# Patient Record
Sex: Male | Born: 1960 | Race: Black or African American | Hispanic: No | Smoking: Former smoker
Health system: Southern US, Community
[De-identification: ages and names within clinical notes are randomized; demographics above are authoritative.]

## PROBLEM LIST (undated history)

## (undated) DIAGNOSIS — T7840XA Allergy, unspecified, initial encounter: Secondary | ICD-10-CM

## (undated) DIAGNOSIS — M1990 Unspecified osteoarthritis, unspecified site: Secondary | ICD-10-CM

## (undated) DIAGNOSIS — K219 Gastro-esophageal reflux disease without esophagitis: Secondary | ICD-10-CM

## (undated) DIAGNOSIS — E785 Hyperlipidemia, unspecified: Secondary | ICD-10-CM

## (undated) HISTORY — DX: Gastro-esophageal reflux disease without esophagitis: K21.9

## (undated) HISTORY — DX: Allergy, unspecified, initial encounter: T78.40XA

## (undated) HISTORY — DX: Unspecified osteoarthritis, unspecified site: M19.90

## (undated) HISTORY — DX: Hyperlipidemia, unspecified: E78.5

---

## 2000-09-29 HISTORY — PX: ACHILLES TENDON SURGERY: SHX542

## 2007-07-21 ENCOUNTER — Ambulatory Visit: Payer: Self-pay | Admitting: Family Medicine

## 2007-09-22 ENCOUNTER — Ambulatory Visit: Payer: Self-pay | Admitting: Internal Medicine

## 2007-09-27 ENCOUNTER — Ambulatory Visit: Payer: Self-pay | Admitting: *Deleted

## 2007-09-30 HISTORY — PX: JOINT REPLACEMENT: SHX530

## 2007-09-30 HISTORY — PX: HIP SURGERY: SHX245

## 2008-01-26 ENCOUNTER — Ambulatory Visit: Payer: Self-pay | Admitting: Family Medicine

## 2008-03-07 ENCOUNTER — Ambulatory Visit: Payer: Self-pay | Admitting: Internal Medicine

## 2008-04-06 ENCOUNTER — Ambulatory Visit: Payer: Self-pay | Admitting: Internal Medicine

## 2008-04-08 ENCOUNTER — Ambulatory Visit (HOSPITAL_COMMUNITY): Admission: RE | Admit: 2008-04-08 | Discharge: 2008-04-08 | Payer: Self-pay | Admitting: Family Medicine

## 2008-10-11 ENCOUNTER — Ambulatory Visit: Payer: Self-pay | Admitting: Internal Medicine

## 2008-10-12 ENCOUNTER — Encounter (INDEPENDENT_AMBULATORY_CARE_PROVIDER_SITE_OTHER): Payer: Self-pay | Admitting: Family Medicine

## 2008-10-12 LAB — CONVERTED CEMR LAB
AST: 38 units/L — ABNORMAL HIGH (ref 0–37)
CO2: 26 meq/L (ref 19–32)
Eosinophils Absolute: 0.3 10*3/uL (ref 0.0–0.7)
Eosinophils Relative: 5 % (ref 0–5)
Glucose, Bld: 104 mg/dL — ABNORMAL HIGH (ref 70–99)
Hemoglobin: 14.4 g/dL (ref 13.0–17.0)
Lymphocytes Relative: 29 % (ref 12–46)
Lymphs Abs: 1.8 10*3/uL (ref 0.7–4.0)
MCHC: 31.6 g/dL (ref 30.0–36.0)
Neutro Abs: 3.5 10*3/uL (ref 1.7–7.7)
Neutrophils Relative %: 58 % (ref 43–77)
Sodium: 144 meq/L (ref 135–145)
Total Protein: 7.8 g/dL (ref 6.0–8.3)
WBC: 6.1 10*3/uL (ref 4.0–10.5)

## 2008-12-12 ENCOUNTER — Inpatient Hospital Stay (HOSPITAL_COMMUNITY): Admission: RE | Admit: 2008-12-12 | Discharge: 2008-12-14 | Payer: Self-pay | Admitting: Orthopedic Surgery

## 2009-02-14 ENCOUNTER — Ambulatory Visit: Payer: Self-pay | Admitting: Internal Medicine

## 2009-04-13 ENCOUNTER — Emergency Department (HOSPITAL_COMMUNITY): Admission: EM | Admit: 2009-04-13 | Discharge: 2009-04-13 | Payer: Self-pay | Admitting: Emergency Medicine

## 2009-04-25 ENCOUNTER — Ambulatory Visit: Payer: Self-pay | Admitting: Internal Medicine

## 2009-04-25 LAB — CONVERTED CEMR LAB
BUN: 19 mg/dL (ref 6–23)
Calcium: 9.3 mg/dL (ref 8.4–10.5)
Glucose, Bld: 92 mg/dL (ref 70–99)
PSA: 1.13 ng/mL (ref 0.10–4.00)

## 2009-05-01 ENCOUNTER — Ambulatory Visit: Payer: Self-pay | Admitting: Internal Medicine

## 2009-05-03 ENCOUNTER — Ambulatory Visit (HOSPITAL_COMMUNITY): Admission: RE | Admit: 2009-05-03 | Discharge: 2009-05-03 | Payer: Self-pay | Admitting: Internal Medicine

## 2009-05-17 ENCOUNTER — Ambulatory Visit: Payer: Self-pay | Admitting: Internal Medicine

## 2009-05-29 ENCOUNTER — Encounter: Admission: RE | Admit: 2009-05-29 | Discharge: 2009-06-19 | Payer: Self-pay | Admitting: Orthopedic Surgery

## 2009-07-02 ENCOUNTER — Encounter: Admission: RE | Admit: 2009-07-02 | Discharge: 2009-07-02 | Payer: Self-pay | Admitting: Family Medicine

## 2009-07-19 ENCOUNTER — Ambulatory Visit (HOSPITAL_COMMUNITY): Admission: RE | Admit: 2009-07-19 | Discharge: 2009-07-19 | Payer: Self-pay | Admitting: Orthopedic Surgery

## 2011-01-05 LAB — CBC
HCT: 42.8 % (ref 39.0–52.0)
Hemoglobin: 14.6 g/dL (ref 13.0–17.0)
MCV: 93.3 fL (ref 78.0–100.0)
Platelets: 239 10*3/uL (ref 150–400)
RBC: 4.58 MIL/uL (ref 4.22–5.81)
WBC: 5.8 10*3/uL (ref 4.0–10.5)

## 2011-01-05 LAB — RAPID URINE DRUG SCREEN, HOSP PERFORMED
Amphetamines: NOT DETECTED
Barbiturates: NOT DETECTED
Benzodiazepines: NOT DETECTED
Tetrahydrocannabinol: NOT DETECTED

## 2011-01-05 LAB — BASIC METABOLIC PANEL
BUN: 11 mg/dL (ref 6–23)
CO2: 24 mEq/L (ref 19–32)
Chloride: 107 mEq/L (ref 96–112)
GFR calc Af Amer: >60 mL/min (ref >60)
GFR calc non Af Amer: >60 mL/min (ref >60)
Glucose, Bld: 104 mg/dL — ABNORMAL HIGH (ref 70–99)
Potassium: 3.8 mEq/L (ref 3.5–5.1)

## 2011-01-05 LAB — DIFFERENTIAL
Basophils Relative: 1 % (ref 0–1)
Eosinophils Absolute: 0.2 10*3/uL (ref 0.0–0.7)
Eosinophils Relative: 4 % (ref 0–5)
Neutrophils Relative %: 47 % (ref 43–77)

## 2011-01-09 LAB — URINALYSIS, ROUTINE W REFLEX MICROSCOPIC
Bilirubin Urine: NEGATIVE
Hgb urine dipstick: NEGATIVE
Ketones, ur: NEGATIVE mg/dL
Protein, ur: NEGATIVE mg/dL
Specific Gravity, Urine: 1.03 (ref 1.005–1.030)
Urobilinogen, UA: 1 mg/dL (ref 0.0–1.0)
pH: 6.5 (ref 5.0–8.0)

## 2011-01-09 LAB — BASIC METABOLIC PANEL
BUN: 6 mg/dL (ref 6–23)
BUN: 8 mg/dL (ref 6–23)
Calcium: 8.2 mg/dL — ABNORMAL LOW (ref 8.4–10.5)
Chloride: 101 mEq/L (ref 96–112)
Chloride: 106 mEq/L (ref 96–112)
Creatinine, Ser: 0.84 mg/dL (ref 0.4–1.5)
GFR calc Af Amer: >60 mL/min (ref >60)
GFR calc non Af Amer: >60 mL/min (ref >60)
GFR calc non Af Amer: >60 mL/min (ref >60)
Glucose, Bld: 97 mg/dL (ref 70–99)
Potassium: 3.6 mEq/L (ref 3.5–5.1)
Potassium: 3.8 mEq/L (ref 3.5–5.1)
Potassium: 4.3 mEq/L (ref 3.5–5.1)
Sodium: 140 mEq/L (ref 135–145)

## 2011-01-09 LAB — CBC
HCT: 33.9 % — ABNORMAL LOW (ref 39.0–52.0)
HCT: 35.5 % — ABNORMAL LOW (ref 39.0–52.0)
HCT: 42.6 % (ref 39.0–52.0)
MCHC: 33.7 g/dL (ref 30.0–36.0)
MCV: 94.7 fL (ref 78.0–100.0)
Platelets: 219 10*3/uL (ref 150–400)
Platelets: 259 10*3/uL (ref 150–400)
RBC: 3.58 MIL/uL — ABNORMAL LOW (ref 4.22–5.81)
RDW: 13.2 % (ref 11.5–15.5)
RDW: 13.4 % (ref 11.5–15.5)

## 2011-01-09 LAB — DIFFERENTIAL
Eosinophils Absolute: 0.1 10*3/uL (ref 0.0–0.7)
Lymphocytes Relative: 28 % (ref 12–46)
Lymphs Abs: 1.8 10*3/uL (ref 0.7–4.0)
Monocytes Absolute: 0.4 10*3/uL (ref 0.1–1.0)
Neutro Abs: 4 10*3/uL (ref 1.7–7.7)
Neutrophils Relative %: 63 % (ref 43–77)

## 2011-01-09 LAB — TYPE AND SCREEN: ABO/RH(D): O POS

## 2011-01-09 LAB — PROTIME-INR
INR: 1 (ref 0.00–1.49)
Prothrombin Time: 12.9 seconds (ref 11.6–15.2)

## 2011-01-09 LAB — APTT: aPTT: 30 seconds (ref 24–37)

## 2011-02-11 NOTE — H&P (Signed)
NAMEDERAY, DAWES       ACCOUNT NO.:  [id number]   MEDICAL RECORD NO.:  [id number]          PATIENT TYPE:  INP   LOCATION:  NA                           FACILITY:  [facility]   PHYSICIAN:  Madlyn Frankel. Charlann Boxer, M.D.  DATE OF BIRTH:  1960-12-10   DATE OF ADMISSION:  12/12/2008  DATE OF DISCHARGE:                              HISTORY & PHYSICAL   PROCEDURE:  Left total hip replacement.   CHIEF COMPLAINT:  Left hip pain.   HISTORY OF PRESENT ILLNESS:  A 50 year old male with history of left hip  pain secondary to osteoarthritis.  It has been refractory to all  conservative treatment.  He has been using a cane to assist with  ambulation.   PRIMARY CARE PHYSICIAN:  Dineen Kid. Reche Dixon, M.D. with [facility].   PAST MEDICAL HISTORY:  1. Significant for osteoarthritis.  2. Degenerative disk disease.   PAST SURGERIES:  Bilateral Achilles tendon repair.   FAMILY HISTORY:  Arthritis.   SOCIAL HISTORY:  Nonsmoker.   DRUG ALLERGIES:  NO KNOWN DRUG ALLERGIES.   MEDICATIONS:  Vicodin 7.5/750 one p.o. q.8 h. p.r.n. pain.   REVIEW OF SYSTEMS:  None other than HPI.   PHYSICAL EXAMINATION:  VITAL SIGNS:  Pulse 72, respirations 16, blood  pressure 116/86.  GENERAL:  Awake, alert and oriented, well-developed, well-nourished.  HEENT:  Normocephalic.  NECK:  Supple.  No carotid bruits.  CHEST/LUNGS:  Clear to auscultation bilaterally.  BREASTS:  Deferred.  HEART:  S1-S2 distinct.  ABDOMEN:  Soft, nontender, bowel sounds are present.  PELVIS:  Stable.  GENITOURINARY:  Deferred.  EXTREMITIES:  Left hip has decreased range of motion and increased pain.  SKIN:  No cellulitis.  NEUROLOGIC:  Intact distal sensibilities.   LABORATORY DATA:  Labs, EKG, chest x-ray are all pending presurgical  testing.   IMPRESSION:  Left hip osteoarthritis.   PLAN OF ACTION:  Left total hip replacement by Dr. Charlann Boxer at [facility],  December 12, 2008.   Postoperative medications provided today including  aspirin for DVT  prophylaxis.     ______________________________  Yetta Glassman [address], [place]      Madlyn Frankel. Charlann Boxer, M.D.  Electronically Signed    BLM/MEDQ  D:  12/04/2008  T:  12/05/2008  Job:  [id number]   cc:   Dineen Kid. Reche Dixon, M.D.  Fax: [phone]

## 2011-02-11 NOTE — Discharge Summary (Signed)
NAMEJASMOND, Mark Hale       ACCOUNT NO.:  [id number]   MEDICAL RECORD NO.:  [id number]          PATIENT TYPE:  INP   LOCATION:  1603                         FACILITY:  [facility]   PHYSICIAN:  Madlyn Frankel. Charlann Boxer, M.D.  DATE OF BIRTH:  Feb 22, 1961   DATE OF ADMISSION:  12/12/2008  DATE OF DISCHARGE:                               DISCHARGE SUMMARY   ADMISSION DIAGNOSES:  1. Osteoarthritis.  2. Degenerative disc disease.   DISCHARGE DIAGNOSES:  1. Osteoarthritis.  2. Degenerative disc disease.   HISTORY OF PRESENT ILLNESS:  A 50 year old male with a history of left  hip pain secondary to osteoarthritis that is refractory to all  conservative treatment.   CONSULTATIONS:  None.   PROCEDURE:  Left total hip replacement by surgeon Dr. Durene Romans,  assistant Dwyane Luo, PA-C.   LABORATORY DATA:  CBC final reading white blood cell count 9.2,  hemoglobin 11.4, hematocrit 33.9, platelet count 198,000.  Metabolic  panel:  Sodium 132, potassium 3.6, BUN 8, creatinine 0.84, glucose 118.   CLINICAL DATA:  Cardiology, EKG:  Normal sinus rhythm.   HOSPITAL COURSE:  The patient underwent a left total hip replacement by  surgeon Dr. Durene Romans and was admitted to the orthopedic floor.  His  stay was unremarkable.  He remained hemodynamically and orthopedically  stable throughout his course of stay.  The Hemovac was discontinued on  day #1.  Her remained neurovascularly intact in his left lower extremity  throughout.  Dressing was changed.  There was no significant drainage  from the wound.  The wound looked good.  He was weight-bearing as  tolerated, making good progress with physical therapy and by day #2 had  met all criteria for discharge home in stable and improved condition.  He was on aspirin for DVT prophylaxis going home.   DISCHARGE DISPOSITION:  Discharged home with home health care PT in  stable and improved condition.   ACTIVITY:  Physical therapy with weight-bearing as  tolerated with a  walker.   DISCHARGE DIET:  Heart-healthy.   DISCHARGE WOUND CARE:  Keep dry.   DISCHARGE MEDICATIONS:  1. Aspirin 325 mg p.o. b.i.d. x6 weeks.  2. Robaxin 500 mg one p.o. q.6h.  3. Iron 325 mg one p.o. t.i.d. x2 weeks.  4. Colace 100 mg one p.o. b.i.d. p.r.n. constipation.  5. MiraLax 17 grams p.o. daily p.r.n.  6. Norco 7.5/325 one to two tabs every 4 to 6 hours p.r.n. pain.  7. Naproxen p.r.n.  8. Glucosamine p.o. daily.   DISCHARGE FOLLOWUP:  Follow up with Dr. Charlann Boxer at phone number [phone] in  2 weeks for wound check.     ______________________________  Mark Hale, Mark Hale      Madlyn Frankel. Charlann Boxer, M.D.  Electronically Signed    BLM/MEDQ  D:  12/14/2008  T:  12/14/2008  Job:  [id number]   cc:   Dineen Kid. Reche Dixon, M.D.  Fax: [phone]

## 2011-02-11 NOTE — Op Note (Signed)
NAMEBRENNAN, LITZINGER       ACCOUNT NO.:  [id number]   MEDICAL RECORD NO.:  [id number]          PATIENT TYPE:  INP   LOCATION:  1603                         FACILITY:  [facility]   PHYSICIAN:  Madlyn Frankel. Charlann Boxer, M.D.  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   PROCEDURE:  Left total knee replacement.   COMPONENTS:  A DePuy hip system size 56 pinnacle cup,  2 cancellous bone  screws, a 40 mm neutral liner and a 5 high trial lock stem with a 40  +1.5 metal ball.   SURGEON:  Luna Fuse, M.D.   ASSISTANT:  Dwyane Luo, PA   ANESTHESIA:  General.   BLOOD LOSS:  400 mL.   DRAINS:  None.   COMPLICATIONS:  None.   SPECIMEN:  None.   FINDINGS:  None.   INDICATION FOR THE PROCEDURE:  Mr. Oats is a 50 year old gentleman  referred for evaluation of progressive left hip pain with radiographic  evidence of advanced severe degenerative joint changes.  He had failed  any conservative measures and has a significant reduction in his quality  of life due to pain and stiffness and he wished to proceed with  arthroplasty.  Risks of infection, DVT, component failure, dislocation  as well as potential need for revision surgery in addition to discussion  of bearing surfaces were all reviewed and consent was obtained for  benefit of pain relief.   PROCEDURE IN DETAIL:  The patient was brought to the operative theater.  Once adequate anesthesia, preoperative antibiotics, Ancef and  preoperative anesthesia established, the patient was positioned into the  right lateral decubitus position with left side up.   Preoperative  examination of extremities identified that the patient was slightly  shortened on his left side and this was kept in mind during the  procedure, particularly in the position laterally.   The left lower extremity was then prepped and draped after a pre scrub  in a normal routine fashion.  A time-out was performed identifying the  patient and planned procedure of the extremity.  The lateral based  incision was made for a posterior approach of the hip.  The iliotibial  band and gluteal fascia were dissected out and then incised posteriorly.  The short external rotators were taken down except for the posterior  capsule and then a capsulotomy was made preserving the posterior leaflet  for later anatomic repair but also to protect the sciatic nerve from  retractors.  The hip was dislocated and severe degenerative change was  noted. The neck osteotomy was made and the patient had a loose body  which was extracapsular to the inferior aspect of the capsule.  It also  had a large posterior neck osteophyte and a neck osteotomy was made in  the trochanteric fossa  utilizing a broach for a trial neck and head  segment with placement in the center of the head.  Attention was now  directed to the femur where the canal was opened and the drill hand  reamed once and then irrigated to prevent fat emboli.  I was able to  broach all way up to a size 5 which sat level with the neck cut and  stable medial and laterally.   The broach was removed.  Attention was now directed to the acetabulum.  The femur was packed with a sponge to prevent oozing.   The acetabular retractors were placed.  I removed and debrided the  labrum using a long knife blade.  I then began reaming the 44 reamer and  actually reamed all the up to 0.55 reamer.  The patient had good  bleeding bone, but it was sclerotic due to the advanced degenerative  changes but nonetheless very good bleeding bone.  A 56 pinnacle cup was   impacted using the cup position and anatomically evaluated beneath the  rim anteriorly.  A portion of the cup was exposed superolateral.  The  cup appeared to be abducted about 35-40 degrees and forward flexed 20  degrees.  I went ahead placed two cancellous bone screws in addition to  a hole eliminator and the final 40 neutral metal liner.  Trial reduction  was now carried out with a 5 broach.  I chose to use the high offset  neck based on his radiograph and appearance of the hip joint.  The 41.5  ball of the hip was very stable.  Leg lengths appeared to be good  compared to the down leg compared to the preoperative positioning.   There was no evidence of any subluxation.  At this point, the trial  broach was removed and the final 5 high trial lock stem was then opened  and sat level where the broach set.  I re-trialed with a minus ball just  to make sure it would not cause any problems and felt that the 1.5 ball  gave [place] a little bit more length than we needed.   The final 40 +1.5 ball was then impacted onto a clean and dry trunnion  at the level of the trochanter anatomically.   At this point,the hip was reduced.  It had been irrigated throughout the  case and again at this point.  We reapproximated the posterior capsule  superiorly using a 1-Vicryl.  The remainder of the wound was closed at  this point over a medium Hemovac drain and another Vicryl in the  iliotibial band and gluteal fascia, 2-0 Vicryl in the subcu layer and  running 4-0 Monocryl and the hip was cleaned, dried and dressed  sterilely with Steri-Strips and Mepilex dressing.  She was then brought  into the recovery room in stable condition tolerating the procedure  well.      Madlyn Frankel Charlann Boxer, M.D.  Electronically Signed     MDO/MEDQ  D:  12/12/2008  T:  12/12/2008  Job:  [id number]

## 2012-01-26 ENCOUNTER — Other Ambulatory Visit (HOSPITAL_COMMUNITY): Payer: Self-pay | Admitting: Internal Medicine

## 2012-01-26 ENCOUNTER — Ambulatory Visit (HOSPITAL_COMMUNITY)
Admission: RE | Admit: 2012-01-26 | Discharge: 2012-01-26 | Disposition: A | Discharge status: Home / Self Care | Payer: Medicaid Other | Source: Ambulatory Visit | Attending: Internal Medicine | Admitting: Internal Medicine

## 2012-01-26 DIAGNOSIS — M5126 Other intervertebral disc displacement, lumbar region: Secondary | ICD-10-CM

## 2012-01-26 DIAGNOSIS — M545 Low back pain, unspecified: Secondary | ICD-10-CM | POA: Insufficient documentation

## 2012-01-26 DIAGNOSIS — M51379 Other intervertebral disc degeneration, lumbosacral region without mention of lumbar back pain or lower extremity pain: Secondary | ICD-10-CM | POA: Insufficient documentation

## 2012-01-26 DIAGNOSIS — M5137 Other intervertebral disc degeneration, lumbosacral region: Secondary | ICD-10-CM | POA: Insufficient documentation

## 2013-03-05 DIAGNOSIS — F329 Major depressive disorder, single episode, unspecified: Secondary | ICD-10-CM | POA: Insufficient documentation

## 2013-03-05 DIAGNOSIS — G894 Chronic pain syndrome: Secondary | ICD-10-CM | POA: Insufficient documentation

## 2013-03-05 DIAGNOSIS — Z8781 Personal history of (healed) traumatic fracture: Secondary | ICD-10-CM | POA: Insufficient documentation

## 2013-03-05 DIAGNOSIS — M545 Low back pain, unspecified: Secondary | ICD-10-CM | POA: Insufficient documentation

## 2013-03-05 DIAGNOSIS — M159 Polyosteoarthritis, unspecified: Secondary | ICD-10-CM | POA: Insufficient documentation

## 2013-08-08 DIAGNOSIS — E669 Obesity, unspecified: Secondary | ICD-10-CM | POA: Insufficient documentation

## 2013-08-08 DIAGNOSIS — F17200 Nicotine dependence, unspecified, uncomplicated: Secondary | ICD-10-CM | POA: Insufficient documentation

## 2013-08-08 DIAGNOSIS — M47817 Spondylosis without myelopathy or radiculopathy, lumbosacral region: Secondary | ICD-10-CM | POA: Insufficient documentation

## 2014-02-10 DIAGNOSIS — M7711 Lateral epicondylitis, right elbow: Secondary | ICD-10-CM | POA: Insufficient documentation

## 2014-03-08 ENCOUNTER — Encounter (INDEPENDENT_AMBULATORY_CARE_PROVIDER_SITE_OTHER): Payer: Self-pay | Admitting: Surgery

## 2014-03-08 ENCOUNTER — Other Ambulatory Visit (INDEPENDENT_AMBULATORY_CARE_PROVIDER_SITE_OTHER): Payer: Self-pay | Admitting: Surgery

## 2014-03-13 ENCOUNTER — Ambulatory Visit (INDEPENDENT_AMBULATORY_CARE_PROVIDER_SITE_OTHER): Payer: Medicaid Other | Admitting: Surgery

## 2014-03-29 ENCOUNTER — Encounter (INDEPENDENT_AMBULATORY_CARE_PROVIDER_SITE_OTHER): Payer: Self-pay | Admitting: Surgery

## 2014-03-29 ENCOUNTER — Ambulatory Visit (INDEPENDENT_AMBULATORY_CARE_PROVIDER_SITE_OTHER): Payer: Medicaid Other | Admitting: Surgery

## 2014-03-29 VITALS — BP 124/83 | HR 79 | Temp 97.7°F | Resp 18 | Ht 66.0 in | Wt 220.0 lb

## 2014-03-29 DIAGNOSIS — L989 Disorder of the skin and subcutaneous tissue, unspecified: Secondary | ICD-10-CM | POA: Insufficient documentation

## 2014-03-29 DIAGNOSIS — K429 Umbilical hernia without obstruction or gangrene: Secondary | ICD-10-CM

## 2014-03-29 NOTE — Progress Notes (Signed)
Patient ID: Mark Hale, male   DOB: [birth year], 53 y.o.   MRN: [id number]  Chief Complaint  Patient presents with  . Hernia    HPI Mark Hale is a 53 y.o. male.  Referred by Dr. Lindwood Qua for umbilical hernia and enlarging skin lesion - abdominal wall  HPI This is a 53 year old male who presents with many years of a small reducible umbilical hernia. This is slightly enlarged. He denies any obstructive symptoms. He also has a protruding skin lesion on the left side of his abdominal wall that has become larger. Occasionally this bleeds. He is now referred for excision of the skin lesion as well as umbilical hernia repair.   Past Medical History  Diagnosis Date  . Arthritis   Chronic back pain - disabled  Past Surgical History  Procedure Laterality Date  . Hip surgery Left 2009  . Achilles tendon surgery Bilateral 2002    History reviewed. No pertinent family history.  Social History History  Substance Use Topics  . Smoking status: Current Every Day Smoker -- 1.00 packs/day    Types: Cigars  . Smokeless tobacco: Not on file  . Alcohol Use: Yes     Comment: a beer sometimes    No Known Allergies  Current Outpatient Prescriptions  Medication Sig Dispense Refill  . HYDROcodone-acetaminophen (NORCO/VICODIN) 5-325 MG per tablet Take 1 tablet by mouth every 6 (six) hours as needed for moderate pain.      Marland Kitchen omeprazole (PRILOSEC) 20 MG capsule Take 20 mg by mouth daily.      . simvastatin (ZOCOR) 20 MG tablet Take 20 mg by mouth daily.      . traMADol (ULTRAM) 50 MG tablet Take by mouth every 6 (six) hours as needed.       No current facility-administered medications for this visit.    Review of Systems Review of Systems  Constitutional: Negative for fever, chills and unexpected weight change.  HENT: Negative for congestion, hearing loss, sore throat, trouble swallowing and voice change.   Eyes: Negative for visual disturbance.  Respiratory: Negative  for cough and wheezing.   Cardiovascular: Negative for chest pain, palpitations and leg swelling.  Gastrointestinal: Negative for nausea, vomiting, abdominal pain, diarrhea, constipation, blood in stool, abdominal distention, anal bleeding and rectal pain.  Genitourinary: Negative for hematuria and difficulty urinating.  Musculoskeletal: Negative for arthralgias.  Skin: Negative for rash and wound.  Neurological: Negative for seizures, syncope, weakness and headaches.  Hematological: Negative for adenopathy. Does not bruise/bleed easily.  Psychiatric/Behavioral: Negative for confusion.    Blood pressure 124/83, pulse 79, temperature 97.7 F (36.5 C), temperature source Temporal, resp. rate 18, height 5\' 6"  (1.676 m), weight 220 lb (99.791 kg).  Physical Exam Physical Exam WDWN in NAD HEENT:  EOMI, sclera anicteric Neck:  No masses, no thyromegaly Lungs:  CTA bilaterally; normal respiratory effort CV:  Regular rate and rhythm; no murmurs Abd:  +bowel sounds, soft, non-tender, protruding umbilicus - reducible with 1 cm defect Ext:  Well-perfused; no edema Skin:  Warm, dry; no sign of jaundice Left upper abdominal wall - protruding pedunculated pigmented skin lesion 2 cm;   Data Reviewed none  Assessment    1.  Enlarging reducible umbilical hernia 2.  Enlarging/ occasional bleeding skin lesion - abdominal wall     Plan    Umbilical hernia repair with mesh and excision of skin lesion - abdominal wall.  The surgical procedure has been discussed with the patient.  Potential risks, benefits, alternative treatments,  and expected outcomes have been explained.  All of the patient's questions at this time have been answered.  The likelihood of reaching the patient's treatment goal is good.  The patient understand the proposed surgical procedure and wishes to proceed.         Mark Hale K. 03/29/2014, 10:12 AM

## 2014-03-29 NOTE — Patient Instructions (Signed)
Our schedulers will call you to schedule surgery soon.

## 2014-04-11 ENCOUNTER — Other Ambulatory Visit (INDEPENDENT_AMBULATORY_CARE_PROVIDER_SITE_OTHER): Payer: Self-pay

## 2014-04-11 DIAGNOSIS — K429 Umbilical hernia without obstruction or gangrene: Secondary | ICD-10-CM

## 2014-04-11 DIAGNOSIS — D239 Other benign neoplasm of skin, unspecified: Secondary | ICD-10-CM

## 2014-04-11 MED ORDER — OXYCODONE-ACETAMINOPHEN 5-325 MG PO TABS: 1.0000 | ORAL_TABLET | ORAL | Status: DC | PRN

## 2014-04-13 ENCOUNTER — Telehealth (INDEPENDENT_AMBULATORY_CARE_PROVIDER_SITE_OTHER): Payer: Self-pay

## 2014-04-13 NOTE — Telephone Encounter (Signed)
Calling pt to see how he was doinf after sx. Pt states that he has been doing well. Pt states that he has taken his bandage off, SteriStrips are still in place at this time. Encouraged pt not to take those SteriStrips off, let them fall off on their own. Reinforced pt to call our office if he needs anything. Pt verbalized understanding.

## 2014-04-18 ENCOUNTER — Telehealth (INDEPENDENT_AMBULATORY_CARE_PROVIDER_SITE_OTHER): Payer: Self-pay

## 2014-04-18 NOTE — Telephone Encounter (Signed)
Patient called back regarding steri strips. Some have come off on their own and others are starting to. They are starting to cause itching. Asked if ok to take off. Advised since on for 7 days already they can come of especially since they are coming off on their own.

## 2014-04-18 NOTE — Telephone Encounter (Signed)
Pt s/p umbilical hernia repair on 04/11/14 by Dr Georgette Dover. Pt is calling today to get a refill on his Percocet 5/325mg . Pt rates his pain a 8 at this time. Pt states that he is with a pain clinic. Informed pt that the pain clinic will manage his prescriptions. Informed pt that if pain clinic would like for [place] to manage his Percocet then have them call us tomorrow after his appt with them. Pt verbalized understanding and agrees with POC.

## 2014-04-18 NOTE — Telephone Encounter (Signed)
Error - duplicate

## 2014-05-08 ENCOUNTER — Ambulatory Visit (INDEPENDENT_AMBULATORY_CARE_PROVIDER_SITE_OTHER): Payer: Medicaid Other | Admitting: Surgery

## 2014-05-08 VITALS — BP 128/78 | HR 103 | Temp 96.4°F | Ht 66.0 in | Wt 216.0 lb

## 2014-05-08 DIAGNOSIS — L989 Disorder of the skin and subcutaneous tissue, unspecified: Secondary | ICD-10-CM

## 2014-05-08 DIAGNOSIS — K429 Umbilical hernia without obstruction or gangrene: Secondary | ICD-10-CM

## 2014-05-08 NOTE — Progress Notes (Addendum)
Status post umbilical hernia repair with mesh on 04/11/14. This was repaired with a small proceed ventral patch. He also had a small benign skin lesion removed from his abdominal wall. He is doing quite well. No abdominal tenderness. No sign of recurrent hernia. Both incisions are well-healed with no sign of infection.  He may resume regular activity with no restrictions. Followup when necessary.  Mark Hale. 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

## 2014-06-02 ENCOUNTER — Encounter: Payer: Self-pay | Admitting: Surgery

## 2014-07-19 DIAGNOSIS — M533 Sacrococcygeal disorders, not elsewhere classified: Secondary | ICD-10-CM | POA: Insufficient documentation

## 2014-09-29 HISTORY — PX: HERNIA REPAIR: SHX51

## 2014-12-11 DIAGNOSIS — Z79891 Long term (current) use of opiate analgesic: Secondary | ICD-10-CM

## 2014-12-11 DIAGNOSIS — Z5181 Encounter for therapeutic drug level monitoring: Secondary | ICD-10-CM | POA: Insufficient documentation

## 2015-02-20 ENCOUNTER — Encounter: Payer: Self-pay | Admitting: Physical Medicine & Rehabilitation

## 2015-03-26 ENCOUNTER — Encounter: Payer: Medicaid Other | Attending: Physical Medicine & Rehabilitation

## 2015-03-26 ENCOUNTER — Ambulatory Visit (HOSPITAL_BASED_OUTPATIENT_CLINIC_OR_DEPARTMENT_OTHER): Payer: Medicaid Other | Admitting: Physical Medicine & Rehabilitation

## 2015-03-26 ENCOUNTER — Other Ambulatory Visit: Payer: Self-pay | Admitting: Physical Medicine & Rehabilitation

## 2015-03-26 ENCOUNTER — Encounter: Payer: Self-pay | Admitting: Physical Medicine & Rehabilitation

## 2015-03-26 VITALS — BP 151/92 | HR 99 | Resp 14

## 2015-03-26 DIAGNOSIS — M4328 Fusion of spine, sacral and sacrococcygeal region: Secondary | ICD-10-CM

## 2015-03-26 DIAGNOSIS — M533 Sacrococcygeal disorders, not elsewhere classified: Secondary | ICD-10-CM

## 2015-03-26 DIAGNOSIS — G8929 Other chronic pain: Secondary | ICD-10-CM

## 2015-03-26 DIAGNOSIS — Z79899 Other long term (current) drug therapy: Secondary | ICD-10-CM | POA: Insufficient documentation

## 2015-03-26 DIAGNOSIS — Z5181 Encounter for therapeutic drug level monitoring: Secondary | ICD-10-CM

## 2015-03-26 DIAGNOSIS — G894 Chronic pain syndrome: Secondary | ICD-10-CM | POA: Diagnosis not present

## 2015-03-26 DIAGNOSIS — M47816 Spondylosis without myelopathy or radiculopathy, lumbar region: Secondary | ICD-10-CM | POA: Insufficient documentation

## 2015-03-26 DIAGNOSIS — M545 Low back pain, unspecified: Secondary | ICD-10-CM

## 2015-03-26 MED ORDER — TRAMADOL HCL 50 MG PO TABS: 50.0000 mg | ORAL_TABLET | Freq: Four times a day (QID) | ORAL | Status: DC | PRN
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
## 2015-03-26 NOTE — Patient Instructions (Signed)
You have a prescription for tramadol which is for pain I agree that your diagnosis is most likely sacroiliac joint however you also may have some arthritis of the lumbar area and degenerative disc Will do a sacroiliac injection next month Referral for physical therapy in [facility]

## 2015-03-27 LAB — PMP ALCOHOL METABOLITE (ETG)

## 2015-03-30 LAB — PRESCRIPTION MONITORING PROFILE (SOLSTAS)
Amphetamine/Meth: NEGATIVE ng/mL
BARBITURATE SCREEN, URINE: NEGATIVE ng/mL
BENZODIAZEPINE SCREEN, URINE: NEGATIVE ng/mL
BUPRENORPHINE, URINE: NEGATIVE ng/mL
CANNABINOID SCRN UR: NEGATIVE ng/mL
Carisoprodol, Urine: NEGATIVE ng/mL
Cocaine Metabolites: NEGATIVE ng/mL
Creatinine, Urine: 23.23 mg/dL (ref >20.0)
FENTANYL URINE: NEGATIVE ng/mL
MDMA URINE: NEGATIVE ng/mL
MEPERIDINE UR: NEGATIVE ng/mL
METHADONE SCREEN, URINE: NEGATIVE ng/mL
Nitrites, Initial: NEGATIVE ug/mL
OPIATE SCREEN, URINE: NEGATIVE ng/mL
Oxycodone Screen, Ur: NEGATIVE ng/mL
Propoxyphene: NEGATIVE ng/mL
TAPENTADOLUR: NEGATIVE ng/mL
Tramadol Scrn, Ur: NEGATIVE ng/mL
ZOLPIDEM, URINE: NEGATIVE ng/mL
pH, Initial: 4.9 pH (ref 4.5–8.9)

## 2015-03-30 LAB — ETHYL GLUCURONIDE, URINE
ETGU: 3602 ng/mL — AB (ref <500)
ETHYL SULFATE (ETS): 913 ng/mL — AB (ref <100)

## 2015-04-10 NOTE — Progress Notes (Signed)
Urine Drug Screen positive for alcohol.

## 2015-04-26 ENCOUNTER — Encounter: Payer: Medicaid Other | Attending: Physical Medicine & Rehabilitation

## 2015-04-26 ENCOUNTER — Ambulatory Visit (HOSPITAL_BASED_OUTPATIENT_CLINIC_OR_DEPARTMENT_OTHER): Payer: Medicaid Other | Admitting: Physical Medicine & Rehabilitation

## 2015-04-26 ENCOUNTER — Encounter: Payer: Self-pay | Admitting: Physical Medicine & Rehabilitation

## 2015-04-26 VITALS — BP 135/87 | HR 94 | Resp 16

## 2015-04-26 DIAGNOSIS — G8929 Other chronic pain: Secondary | ICD-10-CM | POA: Insufficient documentation

## 2015-04-26 DIAGNOSIS — M47816 Spondylosis without myelopathy or radiculopathy, lumbar region: Secondary | ICD-10-CM | POA: Insufficient documentation

## 2015-04-26 DIAGNOSIS — M545 Low back pain: Secondary | ICD-10-CM | POA: Insufficient documentation

## 2015-04-26 DIAGNOSIS — M4328 Fusion of spine, sacral and sacrococcygeal region: Secondary | ICD-10-CM | POA: Insufficient documentation

## 2015-04-26 DIAGNOSIS — Z79899 Other long term (current) drug therapy: Secondary | ICD-10-CM | POA: Insufficient documentation

## 2015-04-26 DIAGNOSIS — Z5181 Encounter for therapeutic drug level monitoring: Secondary | ICD-10-CM | POA: Diagnosis not present

## 2015-04-26 DIAGNOSIS — G894 Chronic pain syndrome: Secondary | ICD-10-CM | POA: Insufficient documentation

## 2015-04-26 DIAGNOSIS — M533 Sacrococcygeal disorders, not elsewhere classified: Secondary | ICD-10-CM

## 2015-04-26 NOTE — Patient Instructions (Signed)
Sacroiliac injection was performed today. A combination of a naming medicine plus a cortisone medicine was injected. The injection was done under x-ray guidance. This procedure has been performed to help reduce low back and buttocks pain as well as potentially hip pain. The duration of this injection is variable lasting from hours to  Months. It may repeated if needed. 

## 2015-04-26 NOTE — Progress Notes (Signed)

## 2015-04-26 NOTE — Progress Notes (Signed)
  PROCEDURE RECORD [facility]   Name: Mark Hale DOB:04-13-1961 MRN: [id number]  Date:04/26/2015  Physician: Alysia Penna, MD    Nurse/CMA: Wessling CMA/ Programmer, multimedia  Allergies: No Known Allergies  Consent Signed: Yes.    Is patient diabetic? No.  CBG today?  Pregnant: No. LMP: No LMP for male patient. (age 54-55)  Anticoagulants: no Anti-inflammatory: no Antibiotics: no  Procedure: Right Sacroiliac Steroid Injection Position: Prone Start Time: 11:53 am  End Time: 11:56 am Fluoro Time: 10  RN/CMA Sports administrator CMA    Time 11:15 12:01 pm    BP 135/87 143/93    Pulse 94 100    Respirations 16 16    O2 Sat 98% 97    S/S 6 6    Pain Level 8/10 5/10     D/C home with daughter, patient A & O X 3, D/C instructions reviewed, and sits independently.

## 2015-05-14 ENCOUNTER — Ambulatory Visit: Payer: Medicaid Other | Attending: Physical Medicine & Rehabilitation

## 2015-05-15 ENCOUNTER — Telehealth: Payer: Self-pay | Admitting: Physical Medicine & Rehabilitation

## 2015-05-15 NOTE — Telephone Encounter (Signed)
Patient having increased back pain, feels like the shots that he was given on 7/28 have worn off.  Needs to know what he should do, please call patient.

## 2015-05-17 NOTE — Telephone Encounter (Signed)
Repeat SI joint injection

## 2015-05-23 ENCOUNTER — Telehealth: Payer: Self-pay | Admitting: Physical Medicine & Rehabilitation

## 2015-05-23 MED ORDER — TRAMADOL HCL 50 MG PO TABS: 50.0000 mg | ORAL_TABLET | Freq: Four times a day (QID) | ORAL | Status: DC | PRN

## 2015-05-23 NOTE — Telephone Encounter (Signed)
Patient needing a refill on Tramadol called into his pharmacy [facility]

## 2015-05-23 NOTE — Telephone Encounter (Signed)
Refill called to [facility]. Mr Cardosa notified

## 2015-05-31 ENCOUNTER — Encounter: Payer: Self-pay | Admitting: Physical Medicine & Rehabilitation

## 2015-05-31 ENCOUNTER — Encounter: Payer: Medicaid Other | Attending: Physical Medicine & Rehabilitation

## 2015-05-31 ENCOUNTER — Ambulatory Visit (HOSPITAL_BASED_OUTPATIENT_CLINIC_OR_DEPARTMENT_OTHER): Payer: Medicaid Other | Admitting: Physical Medicine & Rehabilitation

## 2015-05-31 VITALS — BP 148/91 | HR 107 | Resp 15

## 2015-05-31 DIAGNOSIS — Z5181 Encounter for therapeutic drug level monitoring: Secondary | ICD-10-CM | POA: Diagnosis not present

## 2015-05-31 DIAGNOSIS — G894 Chronic pain syndrome: Secondary | ICD-10-CM | POA: Insufficient documentation

## 2015-05-31 DIAGNOSIS — M4328 Fusion of spine, sacral and sacrococcygeal region: Secondary | ICD-10-CM

## 2015-05-31 DIAGNOSIS — M533 Sacrococcygeal disorders, not elsewhere classified: Secondary | ICD-10-CM

## 2015-05-31 DIAGNOSIS — Z79899 Other long term (current) drug therapy: Secondary | ICD-10-CM | POA: Insufficient documentation

## 2015-05-31 DIAGNOSIS — M47816 Spondylosis without myelopathy or radiculopathy, lumbar region: Secondary | ICD-10-CM | POA: Insufficient documentation

## 2015-05-31 DIAGNOSIS — M545 Low back pain: Secondary | ICD-10-CM | POA: Insufficient documentation

## 2015-05-31 DIAGNOSIS — G8929 Other chronic pain: Secondary | ICD-10-CM | POA: Insufficient documentation

## 2015-05-31 NOTE — Progress Notes (Signed)

## 2015-05-31 NOTE — Progress Notes (Signed)
  PROCEDURE RECORD [facility]   Name: Mark Hale DOB:[birth year] MRN: [id number]  Date:05/31/2015  Physician: Alysia Penna, MD    Nurse/CMA: Mancel Parsons  Allergies: No Known Allergies  Consent Signed: Yes.    Is patient diabetic? No.  CBG today? na  Pregnant: No. LMP: No LMP for male patient. (age 54-55)  Anticoagulants: no Anti-inflammatory: no Antibiotics: no  Procedure: right sacroiliac steroid injection Position: Prone Start Time: 10:55 AM  End Time: 10:59 am  Fluoro Time: 12  RN/CMA wes Maranatha Grossi Ken Wessling    Time 1036 11:02am    BP 148/91 142/94    Pulse 107 103    Respirations 16 16    O2 Sat 97 97    S/S 6 6    Pain Level 7.5/10 4/10     D/C home with Amado Nash (friend), patient A & O X 3, D/C instructions reviewed, and sits independently.

## 2015-05-31 NOTE — Patient Instructions (Signed)
Sacroiliac injection was performed today. A combination of a naming medicine plus a cortisone medicine was injected. The injection was done under x-ray guidance. This procedure has been performed to help reduce low back and buttocks pain as well as potentially hip pain. The duration of this injection is variable lasting from hours to  Months. It may repeated if needed. 

## 2015-06-15 ENCOUNTER — Telehealth: Payer: Self-pay | Admitting: *Deleted

## 2015-06-15 NOTE — Telephone Encounter (Signed)
Pt called says the pain has returned following his procedure (right sacroiliac). He says he was told to call back if this last injection was not as good as the previous one.  He is asking for a call back from his Dr. Letta Pate.

## 2015-06-15 NOTE — Telephone Encounter (Signed)
Schedule for Right L3,4,5 MBB

## 2015-06-21 ENCOUNTER — Telehealth: Payer: Self-pay | Admitting: *Deleted

## 2015-06-21 NOTE — Telephone Encounter (Signed)
Mark Hale called about getting a refill on his tramadol.  He has one refill so I have called to notify him.

## 2015-06-22 ENCOUNTER — Telehealth: Payer: Self-pay | Admitting: *Deleted

## 2015-06-22 MED ORDER — TRAMADOL HCL 50 MG PO TABS: 50.0000 mg | ORAL_TABLET | Freq: Four times a day (QID) | ORAL | Status: DC | PRN

## 2015-06-25 NOTE — Telephone Encounter (Signed)
Error

## 2015-06-26 ENCOUNTER — Ambulatory Visit: Payer: Medicaid Other | Admitting: Physical Medicine & Rehabilitation

## 2015-07-06 ENCOUNTER — Encounter: Payer: Medicaid Other | Attending: Physical Medicine & Rehabilitation

## 2015-07-06 ENCOUNTER — Ambulatory Visit: Payer: Medicaid Other | Admitting: Physical Medicine & Rehabilitation

## 2015-07-06 DIAGNOSIS — Z5181 Encounter for therapeutic drug level monitoring: Secondary | ICD-10-CM | POA: Insufficient documentation

## 2015-07-06 DIAGNOSIS — M47816 Spondylosis without myelopathy or radiculopathy, lumbar region: Secondary | ICD-10-CM | POA: Insufficient documentation

## 2015-07-06 DIAGNOSIS — G894 Chronic pain syndrome: Secondary | ICD-10-CM | POA: Insufficient documentation

## 2015-07-06 DIAGNOSIS — G8929 Other chronic pain: Secondary | ICD-10-CM | POA: Insufficient documentation

## 2015-07-06 DIAGNOSIS — Z79899 Other long term (current) drug therapy: Secondary | ICD-10-CM | POA: Insufficient documentation

## 2015-07-06 DIAGNOSIS — M4328 Fusion of spine, sacral and sacrococcygeal region: Secondary | ICD-10-CM | POA: Insufficient documentation

## 2015-07-06 DIAGNOSIS — M545 Low back pain: Secondary | ICD-10-CM | POA: Insufficient documentation

## 2015-08-15 ENCOUNTER — Telehealth: Payer: Self-pay

## 2015-08-15 NOTE — Telephone Encounter (Signed)
Patient called requesting tramadol refill.  Advise patient refill request was to early and to contact his pharmacy on 08/20/15 and have them send [place] a request.

## 2015-08-20 ENCOUNTER — Telehealth: Payer: Self-pay

## 2015-08-20 MED ORDER — TRAMADOL HCL 50 MG PO TABS: 50.0000 mg | ORAL_TABLET | Freq: Four times a day (QID) | ORAL | Status: DC | PRN

## 2015-08-20 NOTE — Telephone Encounter (Signed)
Sent in refill for Tramadol for pt as requested.

## 2015-08-21 ENCOUNTER — Telehealth: Payer: Self-pay

## 2015-08-21 NOTE — Telephone Encounter (Signed)
Left message for pt to let him know that I called in rx for Tramadol on 08/20/15. Pt was informed to call pharmacy.

## 2015-08-21 NOTE — Telephone Encounter (Signed)
Patient called requesting pain medication be called into [facility].

## 2015-09-07 ENCOUNTER — Encounter: Payer: Self-pay | Admitting: Physical Medicine & Rehabilitation

## 2015-09-07 ENCOUNTER — Encounter: Payer: Medicaid Other | Attending: Physical Medicine & Rehabilitation

## 2015-09-07 ENCOUNTER — Ambulatory Visit (HOSPITAL_BASED_OUTPATIENT_CLINIC_OR_DEPARTMENT_OTHER): Payer: Medicaid Other | Admitting: Physical Medicine & Rehabilitation

## 2015-09-07 VITALS — BP 133/85 | HR 106 | Resp 14

## 2015-09-07 DIAGNOSIS — Z79899 Other long term (current) drug therapy: Secondary | ICD-10-CM | POA: Diagnosis not present

## 2015-09-07 DIAGNOSIS — G894 Chronic pain syndrome: Secondary | ICD-10-CM | POA: Diagnosis present

## 2015-09-07 DIAGNOSIS — M47816 Spondylosis without myelopathy or radiculopathy, lumbar region: Secondary | ICD-10-CM | POA: Insufficient documentation

## 2015-09-07 DIAGNOSIS — M545 Low back pain: Secondary | ICD-10-CM | POA: Diagnosis not present

## 2015-09-07 DIAGNOSIS — M533 Sacrococcygeal disorders, not elsewhere classified: Secondary | ICD-10-CM | POA: Diagnosis not present

## 2015-09-07 DIAGNOSIS — M4328 Fusion of spine, sacral and sacrococcygeal region: Secondary | ICD-10-CM | POA: Diagnosis not present

## 2015-09-07 DIAGNOSIS — Z5181 Encounter for therapeutic drug level monitoring: Secondary | ICD-10-CM | POA: Diagnosis not present

## 2015-09-07 DIAGNOSIS — G8929 Other chronic pain: Secondary | ICD-10-CM | POA: Insufficient documentation

## 2015-09-07 MED ORDER — TRAMADOL HCL 50 MG PO TABS: 100.0000 mg | ORAL_TABLET | Freq: Four times a day (QID) | ORAL | Status: DC | PRN

## 2015-09-07 NOTE — Patient Instructions (Signed)
Dr. [facility] [address] , [place] [address] [phone] 

## 2015-09-07 NOTE — Progress Notes (Signed)
Subjective:    Patient ID: Mark Hale, male    DOB: 31-Mar-1961, 54 y.o.   MRN: [id number]  HPI 54 year old male with prior history of cocaine abuse that has had a long history of low back pain. 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 Underwent sacroiliac injections in July and in September of this year each of which produced at least 50% relief however only lasted about 1 week. He is looking for other treatment options. He states the tramadol is only partially effective. We did discussed based on his history would not recommend stronger narcotic analgesics. Pain Inventory Average Pain 8 Pain Right Now 9 My pain is no selection  In the last 24 hours, has pain interfered with the following? General activity 7 Relation with others 6 Enjoyment of life 7 What TIME of day is your pain at its worst? daytime Sleep (in general) Fair  Pain is worse with: walking, bending, sitting, standing and some activites Pain improves with: medication Relief from Meds: 5  Mobility walk without assistance how many minutes can you walk? 6 ability to climb steps?  yes do you drive?  yes  Function disabled: date disabled . I need assistance with the following:  dressing and household duties  Neuro/Psych numbness tingling trouble walking  Prior Studies Any changes since last visit?  no  Physicians involved in your care Any changes since last visit?  no   History reviewed. No pertinent family history. Social History   Social History  . Marital Status: Single    Spouse Name: N/A  . Number of Children: N/A  . Years of Education: N/A   Social History Main Topics  . Smoking status: Current Every Day Smoker -- 1.00 packs/day    Types: Cigars  . Smokeless tobacco: None  . Alcohol Use: Yes     Comment: a beer sometimes  . Drug Use: No  . Sexual Activity: Not Asked   Other Topics Concern  . None    Social History Narrative   Past Surgical History  Procedure Laterality Date  . Hip surgery Left 2009  . Achilles tendon surgery Bilateral 2002   Past Medical History  Diagnosis Date  . Arthritis    BP 133/85 mmHg  Pulse 106  Resp 14  SpO2 98%  Opioid Risk Score:   Fall Risk Score:  `1  Depression screen PHQ 2/9  Depression screen [facility] 2/9 09/07/2015 04/26/2015 03/26/2015  Decreased Interest 2 2 2   Down, Depressed, Hopeless 1 1 1   PHQ - 2 Score 3 3 3   Altered sleeping 2 - 2  Tired, decreased energy 2 - 2  Change in appetite 0 - 0  Feeling bad or failure about yourself  0 - 0  Trouble concentrating 0 - 0  Moving slowly or fidgety/restless 0 - 0  Suicidal thoughts 0 - 0  PHQ-9 Score 7 - 7     Review of Systems  Musculoskeletal: Positive for gait problem.  Neurological: Positive for numbness.       Tingling  All other systems reviewed and are negative.      Objective:   Physical Exam  Constitutional: He is oriented to person, place, and time. He appears well-developed.  obese  Eyes: Conjunctivae and EOM are normal. Pupils are equal, round, and reactive to light.  Neurological: He is alert and oriented to person, place, and time.  Psychiatric: He has a normal mood and affect.  Nursing note and vitals reviewed.   Patient has good strength bilaterally. Ambulation without evidence ofToe drag or knee instability.       Assessment & Plan:  1. Right sacroiliac disorder as evidenced by good response to sacroiliac injection. Unfortunately short duration. Discussed treatment options including chiropractic treatment, sacroiliac radiofrequency, medication management.  He is currently on tramadol 50 mg 4 times a day. Recommend increasing to 100 mg 3 times a day  Discussed sacroiliac radiofrequency is not always effective based on individual variation with innervation. He does not wish to pursue this route.  We discussed chiropractic care and have made referral  to [facility]. [facility] [address] [place], [place] [address] [phone]  Return to clinic 6 months  Over half of the 15 min visit was spent counseling and coordinating care.

## 2015-10-08 ENCOUNTER — Other Ambulatory Visit: Payer: Self-pay | Admitting: Physical Medicine & Rehabilitation

## 2015-10-08 NOTE — Telephone Encounter (Signed)
Last note say 100mg  TID Tramadol,#180

## 2015-10-08 NOTE — Telephone Encounter (Signed)
Sig corrected and called to pharmacy

## 2015-11-07 ENCOUNTER — Other Ambulatory Visit: Payer: Self-pay | Admitting: Physical Medicine & Rehabilitation

## 2015-12-20 ENCOUNTER — Ambulatory Visit (INDEPENDENT_AMBULATORY_CARE_PROVIDER_SITE_OTHER): Payer: Medicaid Other | Admitting: Internal Medicine

## 2015-12-20 ENCOUNTER — Encounter: Payer: Self-pay | Admitting: Internal Medicine

## 2015-12-20 VITALS — BP 114/86 | HR 96 | Temp 97.6°F | Ht 66.0 in | Wt 243.0 lb

## 2015-12-20 DIAGNOSIS — K219 Gastro-esophageal reflux disease without esophagitis: Secondary | ICD-10-CM | POA: Diagnosis not present

## 2015-12-20 DIAGNOSIS — Z0000 Encounter for general adult medical examination without abnormal findings: Secondary | ICD-10-CM

## 2015-12-20 DIAGNOSIS — E785 Hyperlipidemia, unspecified: Secondary | ICD-10-CM | POA: Insufficient documentation

## 2015-12-20 DIAGNOSIS — Z91048 Other nonmedicinal substance allergy status: Secondary | ICD-10-CM

## 2015-12-20 DIAGNOSIS — M7701 Medial epicondylitis, right elbow: Secondary | ICD-10-CM

## 2015-12-20 DIAGNOSIS — Z9109 Other allergy status, other than to drugs and biological substances: Secondary | ICD-10-CM

## 2015-12-20 LAB — LIPID PANEL
CHOL/HDL RATIO: 5.1 ratio — AB (ref <=5.0)
CHOLESTEROL: 172 mg/dL (ref 125–200)
HDL: 34 mg/dL — AB (ref >=40)
LDL Cholesterol: 119 mg/dL (ref <130)
Triglycerides: 96 mg/dL (ref <150)
VLDL: 19 mg/dL (ref <30)

## 2015-12-20 LAB — HIV ANTIBODY (ROUTINE TESTING W REFLEX): HIV 1&2 Ab, 4th Generation: NONREACTIVE

## 2015-12-20 MED ORDER — LORATADINE 10 MG PO TABS: 10.0000 mg | ORAL_TABLET | Freq: Every day | ORAL | Status: DC

## 2015-12-20 MED ORDER — OMEPRAZOLE 20 MG PO CPDR: 20.0000 mg | DELAYED_RELEASE_CAPSULE | Freq: Every day | ORAL | Status: DC

## 2015-12-20 MED ORDER — SIMVASTATIN 20 MG PO TABS: 20.0000 mg | ORAL_TABLET | Freq: Every day | ORAL | Status: DC

## 2015-12-20 NOTE — Assessment & Plan Note (Addendum)
Worse in the spring time. Has been on Claritin, which has worked well for him in the past. - Will prescribe Claritin 10mg  daily - Follow-up in 6 months

## 2015-12-20 NOTE — Assessment & Plan Note (Addendum)
Pt has taken Zocor in the past and tolerated it without any side effects. Cannot remember the last time he had a lipid panel done. - Will prescribe Zocor 20mg  daily - Lipid panel ordered today - Follow-up in 6 months
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
## 2015-12-20 NOTE — Patient Instructions (Addendum)
It was so nice to meet you!  I have prescribed your Zocor and Prilosec. You can pick these up at your pharmacy. I have also sent in Claritin for you to take daily.  Please use the Voltaren gel 4 times per day to help decrease the inflammation in your elbow.  I have ordered some labs- cholesterol, hepatitis C, HIV, and PSA. I will send you a letter in the mail with your results.   I have also ordered a colonoscopy for you. You should hear from our office in 2 weeks to schedule this appointment.   We will see you back in 6 months.  -Dr. Brett Albino

## 2015-12-20 NOTE — Assessment & Plan Note (Signed)
Has been on Prilosec in the past, which helped a lot. Currently using Tums, which doesn't help. - Will prescribe Prilosec 20mg  daily - Follow-up in 6 months

## 2015-12-20 NOTE — Assessment & Plan Note (Signed)
Pt with point tenderness where the tendon inserts on the medial epicondyle. Has been using Voltaren gel once daily.  - Advised Pt to use Voltaren gel 4 times today to maximize effect - May need to consider referral to Sports Med if pain not relieved by Voltaren at max dose - Pt will ask pain clinic if they do steroid injections into the elbow. - Follow-up in 6 months or earlier if pain not improved.

## 2015-12-20 NOTE — Assessment & Plan Note (Signed)
Pt overdue for Colonoscopy. Has never had Hep C screening, but has had HIV screening in the past (we do not have results for this). Previous PCP was following PSAs and he states he is due for this too. - GI referral placed for colonoscopy - Hep C screen, HIV, and PSA ordered today - Follow-up in 6 months

## 2015-12-21 ENCOUNTER — Encounter: Payer: Self-pay | Admitting: Internal Medicine

## 2015-12-21 LAB — HEPATITIS C ANTIBODY: HCV AB: NEGATIVE

## 2015-12-21 LAB — PSA: PSA: 2.12 ng/mL (ref <=4.00)

## 2016-01-23 ENCOUNTER — Other Ambulatory Visit: Payer: Self-pay | Admitting: Internal Medicine

## 2016-01-23 MED ORDER — DICLOFENAC SODIUM 1 % TD GEL: 2.0000 g | Freq: Four times a day (QID) | TRANSDERMAL | Status: DC

## 2016-01-23 NOTE — Telephone Encounter (Signed)
Pt is calling for a refill on his Voltaren gel. jw

## 2016-01-23 NOTE — Telephone Encounter (Signed)
Medication refilled

## 2016-03-03 ENCOUNTER — Encounter: Payer: Self-pay | Admitting: Physical Medicine & Rehabilitation

## 2016-03-03 ENCOUNTER — Ambulatory Visit (HOSPITAL_BASED_OUTPATIENT_CLINIC_OR_DEPARTMENT_OTHER): Payer: Medicaid Other | Admitting: Physical Medicine & Rehabilitation

## 2016-03-03 ENCOUNTER — Encounter: Payer: Medicaid Other | Attending: Physical Medicine & Rehabilitation

## 2016-03-03 VITALS — BP 145/90 | HR 89 | Resp 14

## 2016-03-03 DIAGNOSIS — Z96649 Presence of unspecified artificial hip joint: Secondary | ICD-10-CM | POA: Insufficient documentation

## 2016-03-03 DIAGNOSIS — M4328 Fusion of spine, sacral and sacrococcygeal region: Secondary | ICD-10-CM | POA: Insufficient documentation

## 2016-03-03 DIAGNOSIS — M7062 Trochanteric bursitis, left hip: Secondary | ICD-10-CM | POA: Insufficient documentation

## 2016-03-03 DIAGNOSIS — M545 Low back pain: Secondary | ICD-10-CM | POA: Diagnosis present

## 2016-03-03 DIAGNOSIS — Z96642 Presence of left artificial hip joint: Secondary | ICD-10-CM

## 2016-03-03 DIAGNOSIS — M25552 Pain in left hip: Secondary | ICD-10-CM | POA: Insufficient documentation

## 2016-03-03 DIAGNOSIS — G8929 Other chronic pain: Secondary | ICD-10-CM | POA: Insufficient documentation

## 2016-03-03 DIAGNOSIS — E785 Hyperlipidemia, unspecified: Secondary | ICD-10-CM | POA: Diagnosis not present

## 2016-03-03 DIAGNOSIS — K219 Gastro-esophageal reflux disease without esophagitis: Secondary | ICD-10-CM | POA: Insufficient documentation

## 2016-03-03 DIAGNOSIS — M533 Sacrococcygeal disorders, not elsewhere classified: Secondary | ICD-10-CM

## 2016-03-03 MED ORDER — TRAMADOL HCL 50 MG PO TABS: 100.0000 mg | ORAL_TABLET | Freq: Three times a day (TID) | ORAL | Status: DC | PRN

## 2016-03-03 NOTE — Progress Notes (Signed)
Subjective:    Patient ID: Mark Hale, male    DOB: Mar 01, 1961, 55 y.o.   MRN: [id number]  HPI His chronic low back pain both sides, also has developed some left hip pain. Made a referral to chiropractic however his Medicaid insurance did not cover this. Patient is planning to join a gym. We discussed exercises for weight loss.  Prior history of left hip replacement. He does not remember which physician perform the surgery.  Pain Inventory Average Pain 8 Pain Right Now 9 My pain is sharp, stabbing and tingling  In the last 24 hours, has pain interfered with the following? General activity 7 Relation with others 7 Enjoyment of life 8 What TIME of day is your pain at its worst? morning, night  Sleep (in general) Fair  Pain is worse with: walking, bending and some activites Pain improves with: medication Relief from Meds: 9  Mobility walk without assistance how many minutes can you walk? 5 ability to climb steps?  yes do you drive?  yes  Function disabled: date disabled 2004  Neuro/Psych No problems in this area  Prior Studies Any changes since last visit?  no  Physicians involved in your care Any changes since last visit?  no   Family History  Problem Relation Age of Onset  . Cancer Father     prostate and colon   Social History   Social History  . Marital Status: Single    Spouse Name: N/A  . Number of Children: N/A  . Years of Education: N/A   Social History Main Topics  . Smoking status: Current Every Day Smoker -- 1.00 packs/day    Types: Cigars  . Smokeless tobacco: None  . Alcohol Use: 4.2 oz/week    7 Cans of beer per week     Comment: 1 beer per day  . Drug Use: No  . Sexual Activity: Not Asked   Other Topics Concern  . None   Social History Narrative   Past Surgical History  Procedure Laterality Date  . Hip surgery Left 2009  . Achilles tendon surgery Bilateral 2002  . Joint replacement  2009    hip  . Hernia repair   [id number]    umbilical   Past Medical History  Diagnosis Date  . Arthritis   . Allergy   . GERD (gastroesophageal reflux disease)   . Hyperlipidemia    BP 145/90 mmHg  Pulse 89  Resp 14  SpO2 97%  Opioid Risk Score:   Fall Risk Score:  `1  Depression screen PHQ 2/9  Depression screen [facility] 2/9 12/20/2015 09/07/2015 04/26/2015 03/26/2015  Decreased Interest 0 2 2 2   Down, Depressed, Hopeless 0 1 1 1   PHQ - 2 Score 0 3 3 3   Altered sleeping - 2 - 2  Tired, decreased energy - 2 - 2  Change in appetite - 0 - 0  Feeling bad or failure about yourself  - 0 - 0  Trouble concentrating - 0 - 0  Moving slowly or fidgety/restless - 0 - 0  Suicidal thoughts - 0 - 0  PHQ-9 Score - 7 - 7     Review of Systems  Constitutional: Positive for unexpected weight change.  All other systems reviewed and are negative.      Objective:   Physical Exam  Constitutional: He is oriented to person, place, and time. He appears well-developed and well-nourished.  HENT:  Head: Normocephalic and atraumatic.  Eyes: Conjunctivae and EOM are normal. Pupils  are equal, round, and reactive to light.  Neck: Normal range of motion.  Musculoskeletal:       Left hip: He exhibits bony tenderness.       Lumbar back: He exhibits decreased range of motion and tenderness. He exhibits no deformity.  Patient has tenderness over left greater trochanter of the hip.  Neurological: He is alert and oriented to person, place, and time.  Psychiatric: He has a normal mood and affect.  Nursing note and vitals reviewed.         Assessment & Plan:  1. Right sacroiliac disorder as evidenced by good response to sacroiliac injection. Unfortunately short duration.Chiropractic not an option for the patient due to finances.He is currently on tramadol 100 mg 3 times a day  Follow-up in 6 months  Discussed sacroiliac radiofrequency is not always effective based on individual variation with innervation. He does not wish to pursue  this route.  2. Left hip trochanteric bursitis. I gave him exercises for this. We discussed potential for left hip trochanteric bursa injection. If it gets worse he will call me.

## 2016-03-03 NOTE — Patient Instructions (Signed)
Trochanteric Bursitis You have hip pain due to trochanteric bursitis. Bursitis means that the sack near the outside of the hip is filled with fluid and inflamed. This sack is made up of protective soft tissue. The pain from trochanteric bursitis can be severe and keep you from sleep. It can radiate to the buttocks or down the outside of the thigh to the knee. The pain is almost always worse when rising from the seated or lying position and with walking. Pain can improve after you take a few steps. It happens more often in people with hip joint and lumbar spine problems, such as arthritis or previous surgery. Very rarely the trochanteric bursa can become infected, and antibiotics and/or surgery may be needed. Treatment often includes an injection of local anesthetic mixed with cortisone medicine. This medicine is injected into the area where it is most tender over the hip. Repeat injections may be necessary if the response to treatment is slow. You can apply ice packs over the tender area for 30 minutes every 2 hours for the next few days. Anti-inflammatory and/or narcotic pain medicine may also be helpful. Limit your activity for the next few days if the pain continues. See your caregiver in 5-10 days if you are not greatly improved.  SEEK IMMEDIATE MEDICAL CARE IF:  You develop severe pain, fever, or increased redness.  You have pain that radiates below the knee. EXERCISES STRETCHING EXERCISES - Trochanteric Bursitis  These exercises may help you when beginning to rehabilitate your injury. Your symptoms may resolve with or without further involvement from your physician, physical therapist, or athletic trainer. While completing these exercises, remember:   Restoring tissue flexibility helps normal motion to return to the joints. This allows healthier, less painful movement and activity.  An effective stretch should be held for at least 30 seconds.  A stretch should never be painful. You should only  feel a gentle lengthening or release in the stretched tissue. STRETCH - Iliotibial Band  On the floor or bed, lie on your side so your injured leg is on top. Bend your knee and grab your ankle.  Slowly bring your knee back so that your thigh is in line with your trunk. Keep your heel at your buttocks and gently arch your back so your head, shoulders and hips line up.  Slowly lower your leg so that your knee approaches the floor/bed until you feel a gentle stretch on the outside of your thigh. If you do not feel a stretch and your knee will not fall farther, place the heel of your opposite foot on top of your knee and pull your thigh down farther.  Hold this stretch for __________ seconds.  Repeat __________ times. Complete this exercise __________ times per day. STRETCH - Hamstrings, Supine   Lie on your back. Loop a belt or towel over the ball of your foot as shown.  Straighten your knee and slowly pull on the belt to raise your injured leg. Do not allow the knee to bend. Keep your opposite leg flat on the floor.  Raise the leg until you feel a gentle stretch behind your knee or thigh. Hold this position for __________ seconds.  Repeat __________ times. Complete this stretch __________ times per day. STRETCH - Quadriceps, Prone   Lie on your stomach on a firm surface, such as a bed or padded floor.  Bend your knee and grasp your ankle. If you are unable to reach your ankle or pant leg, use a belt   around your foot to lengthen your reach.  Gently pull your heel toward your buttocks. Your knee should not slide out to the side. You should feel a stretch in the front of your thigh and/or knee.  Hold this position for __________ seconds.  Repeat __________ times. Complete this stretch __________ times per day. STRETCHING - Hip Flexors, Lunge Half kneel with your knee on the floor and your opposite knee bent and directly over your ankle.  Keep good posture with your head over your  shoulders. Tighten your buttocks to point your tailbone downward; this will prevent your back from arching too much.  You should feel a gentle stretch in the front of your thigh and/or hip. If you do not feel any resistance, slightly slide your opposite foot forward and then slowly lunge forward so your knee once again lines up over your ankle. Be sure your tailbone remains pointed downward.  Hold this stretch for __________ seconds.  Repeat __________ times. Complete this stretch __________ times per day. STRETCH - Adductors, Lunge  While standing, spread your legs.  Lean away from your injured leg by bending your opposite knee. You may rest your hands on your thigh for balance.  You should feel a stretch in your inner thigh. Hold for __________ seconds.  Repeat __________ times. Complete this exercise __________ times per day.   This information is not intended to replace advice given to you by your health care provider. Make sure you discuss any questions you have with your health care provider.   Document Released: 10/23/2004 Document Revised: 01/30/2015 Document Reviewed: 12/28/2008 Elsevier Interactive Patient Education 2016 [employer]  

## 2016-03-07 ENCOUNTER — Ambulatory Visit: Payer: Medicaid Other | Admitting: Physical Medicine & Rehabilitation

## 2016-05-06 ENCOUNTER — Encounter (HOSPITAL_COMMUNITY): Payer: Self-pay | Admitting: Emergency Medicine

## 2016-05-06 ENCOUNTER — Ambulatory Visit (HOSPITAL_COMMUNITY)
Admission: EM | Admit: 2016-05-06 | Discharge: 2016-05-06 | Disposition: A | Discharge status: Home / Self Care | Payer: Medicaid Other | Attending: Family Medicine | Admitting: Family Medicine

## 2016-05-06 DIAGNOSIS — M25561 Pain in right knee: Secondary | ICD-10-CM | POA: Diagnosis not present

## 2016-05-06 MED ORDER — MELOXICAM 15 MG PO TABS: 15.0000 mg | ORAL_TABLET | Freq: Every day | ORAL | 0 refills | Status: DC

## 2016-05-06 NOTE — ED Provider Notes (Signed)
CSN: [id number]     Arrival date & time 05/06/16  1003 History   First MD Initiated Contact with Patient 05/06/16 1038     Chief Complaint  Patient presents with  . Knee Pain   (Consider location/radiation/quality/duration/timing/severity/associated sxs/prior Treatment) HPI  Patient reports that on Saturday, he slipped and fell in the grass and heard a pop in his right knee.  Did not fall directly on knee but knee bent directly backwards with the fall.  No bruising, swelling following injury.  Reports that there was immediate intense pain immediately following event.  He reports pain with axial load, he also notes that it began swelling Saturday night.  Has been using epsom salt and ice packs to help with pain and swelling.  Did not relieve.  He reports that he takes Tramadol at baseline but that it has not helped.  Has not taken any NSAIDs.  He has also been using an ACE bandage.  No h/o previous injury.  Reports some numbness when seated for a while.  No buckling.  Using a cane for ambulation.  Past Medical History:  Diagnosis Date  . Allergy   . Arthritis   . GERD (gastroesophageal reflux disease)   . Hyperlipidemia    Past Surgical History:  Procedure Laterality Date  . ACHILLES TENDON SURGERY Bilateral 2002  . HERNIA REPAIR  [id number]   umbilical  . HIP SURGERY Left 2009  . JOINT REPLACEMENT  2009   hip   Family History  Problem Relation Age of Onset  . Cancer Father     prostate and colon   Social History  Substance Use Topics  . Smoking status: Current Every Day Smoker    Packs/day: 1.00    Types: Cigars  . Smokeless tobacco: Not on file  . Alcohol use 4.2 oz/week    7 Cans of beer per week     Comment: 1 beer per day    Review of Systems  Musculoskeletal: Positive for arthralgias, gait problem and joint swelling.    Allergies  Review of patient's allergies indicates no known allergies.  Home Medications   Prior to Admission medications   Medication Sig Start  Date End Date Taking? Authorizing Provider  omeprazole (PRILOSEC) 20 MG capsule Take 1 capsule (20 mg total) by mouth daily. 12/20/15  Yes Sela Hua, MD  simvastatin (ZOCOR) 20 MG tablet Take 1 tablet (20 mg total) by mouth daily. 12/20/15  Yes Sela Hua, MD  traMADol (ULTRAM) 50 MG tablet Take 2 tablets (100 mg total) by mouth 3 (three) times daily as needed. 03/03/16  Yes Charlett Blake, MD  diclofenac sodium (VOLTAREN) 1 % GEL Apply 2 g topically 4 (four) times daily. 01/23/16   Sela Hua, MD  loratadine (CLARITIN) 10 MG tablet Take 1 tablet (10 mg total) by mouth daily. 12/20/15   Sela Hua, MD  meloxicam (MOBIC) 15 MG tablet Take 1 tablet (15 mg total) by mouth daily. 05/06/16   Janora Norlander, DO   Meds Ordered and Administered this Visit  Medications - No data to display  BP 150/91 (BP Location: Left Arm)   Pulse 101   Temp 98.4 F (36.9 C) (Oral)   Resp 20   SpO2 98%  No data found.   Physical Exam  Constitutional: He appears well-developed and well-nourished. No distress.  Musculoskeletal:       Right knee: He exhibits decreased range of motion and swelling. He exhibits no effusion, no ecchymosis,  no deformity, no LCL laxity, normal patellar mobility, no bony tenderness and no MCL laxity. Tenderness found. Medial joint line and MCL tenderness noted. No lateral joint line, no LCL and no patellar tendon tenderness noted.  Loss of about 10 degrees extension of knee and 15 degrees flexion of knee.  Negative anterior and posterior drawers.  Did not appreciate MCL/ LCL laxity but did have pain with MCL testing.  Negative Thessaly. +Antalgic gait, requiring cane for stabilization.    Urgent Care Course   Clinical Course    Procedures (including critical care time)  Labs Review Labs Reviewed - No data to display  Imaging Review No results found.   MDM   1. Knee pain, right    Mark Hale is a 55 y.o. male that presents for right knee pain  after a mechanical fall.  His exam is concerning for MCL injury.  No overt laxity.  No evidence of bony injury.  Negative Thessaly, so I have a low suspicion for meniscal injury.  Patient has tramadol for low back pain, agree with this as analgesic for knee pain.  Will rx NSAID to help with swelling as well.  Discussed how to take medication.  Also, recommend that patient see ortho for possible MRI and further evaluation of knee.  Patient placed in knee immobilizer during this visit.  Continue RICE.  Return precautions reviewed.  Meds ordered this encounter  Medications  . meloxicam (MOBIC) 15 MG tablet    Sig: Take 1 tablet (15 mg total) by mouth daily.    Dispense:  14 tablet    Refill:  0    Fifi Schindler M. Lajuana Ripple, DO PGY-3, [place], DO 05/06/16 1110

## 2016-05-06 NOTE — ED Triage Notes (Signed)
The patient presented to the [facility] with a complaint of right knee pain secondary to a fall 2 days ago. The patient stated that he fell while mowing his grass and his right leg went up underneath him and he heard a "pop."

## 2016-05-06 NOTE — Discharge Instructions (Signed)
I have prescribed Mobic 15mg  to take ONCE daily with food.  This will help with both pain and swelling.  I am also recommending that you see the orthopedist, Dr Percell Miller, to evaluate your knee.  You may need an MRI.  I suspect that you have injured your MCL.  Continue resting, icing, using your brace, and elevating your right knee.

## 2016-05-07 ENCOUNTER — Telehealth: Payer: Self-pay | Admitting: *Deleted

## 2016-05-07 ENCOUNTER — Other Ambulatory Visit: Payer: Self-pay | Admitting: Internal Medicine

## 2016-05-07 DIAGNOSIS — M25561 Pain in right knee: Secondary | ICD-10-CM

## 2016-05-07 NOTE — Telephone Encounter (Signed)
Patient called and states that he was seen at urgent care yesterday and was diagnosed with an ACL injury on the right leg.  He needs a referral to [facility] for this and needs it placed by [place] since he has Medicaid.  Will forward to MD to place in [facility] and then patient just wants [place] to call and give referral information to [facility] and then he will contact them to schedule it.  He has a lot things going on now so he doesn't know what his schedule will be like.  Dory Demont,CMA

## 2016-05-07 NOTE — Telephone Encounter (Signed)
Referral placed for orthopedic surgery. Thanks!

## 2016-05-07 NOTE — Progress Notes (Signed)
Received call from patient that he was just seen in urgent care and diagnosed with an MCL injury. Pt requesting referral to [facility]. Referral placed.  Hyman Bible, MD PGY-2

## 2016-05-09 NOTE — Telephone Encounter (Signed)
Pt was advised. Thanks! ep

## 2016-09-02 ENCOUNTER — Ambulatory Visit: Payer: Medicaid Other | Admitting: Physical Medicine & Rehabilitation

## 2016-09-02 ENCOUNTER — Encounter: Payer: Medicaid Other | Attending: Physical Medicine & Rehabilitation

## 2016-09-18 ENCOUNTER — Telehealth: Payer: Self-pay | Admitting: *Deleted

## 2016-09-18 MED ORDER — TRAMADOL HCL 50 MG PO TABS: 100.0000 mg | ORAL_TABLET | Freq: Three times a day (TID) | ORAL | 0 refills | Status: DC | PRN

## 2016-09-18 NOTE — Telephone Encounter (Signed)
Mark Hale called for a refill on his tramadol.  I sent in one month supply to pharmacy and if he keeps his 6 month appt Oct 06 2016 he can get 6 month supply.  He reports his phone will not allow up to call and leave message that this has been done.

## 2016-10-06 ENCOUNTER — Ambulatory Visit: Payer: Medicaid Other | Admitting: Physical Medicine & Rehabilitation

## 2016-10-16 ENCOUNTER — Ambulatory Visit: Payer: Medicaid Other

## 2016-10-16 ENCOUNTER — Ambulatory Visit: Payer: Medicaid Other | Admitting: Physical Medicine & Rehabilitation

## 2016-10-21 ENCOUNTER — Ambulatory Visit: Payer: Medicaid Other | Admitting: Physical Medicine & Rehabilitation

## 2016-10-21 ENCOUNTER — Ambulatory Visit: Payer: Medicaid Other

## 2016-10-27 ENCOUNTER — Ambulatory Visit (HOSPITAL_BASED_OUTPATIENT_CLINIC_OR_DEPARTMENT_OTHER): Payer: Medicaid Other | Admitting: Physical Medicine & Rehabilitation

## 2016-10-27 ENCOUNTER — Encounter: Payer: Self-pay | Admitting: Physical Medicine & Rehabilitation

## 2016-10-27 ENCOUNTER — Encounter: Payer: Medicaid Other | Attending: Physical Medicine & Rehabilitation

## 2016-10-27 VITALS — BP 129/85 | HR 112 | Resp 14

## 2016-10-27 DIAGNOSIS — F17290 Nicotine dependence, other tobacco product, uncomplicated: Secondary | ICD-10-CM | POA: Diagnosis not present

## 2016-10-27 DIAGNOSIS — M23203 Derangement of unspecified medial meniscus due to old tear or injury, right knee: Secondary | ICD-10-CM | POA: Insufficient documentation

## 2016-10-27 DIAGNOSIS — M1990 Unspecified osteoarthritis, unspecified site: Secondary | ICD-10-CM | POA: Insufficient documentation

## 2016-10-27 DIAGNOSIS — M533 Sacrococcygeal disorders, not elsewhere classified: Secondary | ICD-10-CM | POA: Insufficient documentation

## 2016-10-27 DIAGNOSIS — Z800 Family history of malignant neoplasm of digestive organs: Secondary | ICD-10-CM | POA: Diagnosis not present

## 2016-10-27 DIAGNOSIS — Z96642 Presence of left artificial hip joint: Secondary | ICD-10-CM | POA: Diagnosis not present

## 2016-10-27 DIAGNOSIS — E785 Hyperlipidemia, unspecified: Secondary | ICD-10-CM | POA: Diagnosis not present

## 2016-10-27 DIAGNOSIS — M23321 Other meniscus derangements, posterior horn of medial meniscus, right knee: Secondary | ICD-10-CM | POA: Diagnosis not present

## 2016-10-27 DIAGNOSIS — K219 Gastro-esophageal reflux disease without esophagitis: Secondary | ICD-10-CM | POA: Insufficient documentation

## 2016-10-27 DIAGNOSIS — M545 Low back pain: Secondary | ICD-10-CM | POA: Diagnosis present

## 2016-10-27 DIAGNOSIS — Z8042 Family history of malignant neoplasm of prostate: Secondary | ICD-10-CM | POA: Insufficient documentation

## 2016-10-27 MED ORDER — TRAMADOL HCL 50 MG PO TABS: 100.0000 mg | ORAL_TABLET | Freq: Three times a day (TID) | ORAL | 5 refills | Status: DC | PRN

## 2016-10-27 NOTE — Patient Instructions (Signed)
[facility] [address] [place], [place] [address] [phone]

## 2016-10-27 NOTE — Progress Notes (Signed)
Subjective:    Patient ID: Mark Hale, male    DOB: Sep 17, 1961, 56 y.o.   MRN: [id number]  HPI Last visit in June 2017. At that time, the patient was complaining mainly of right-sided low back pain. In the interval time, patient had a fall and for the medial meniscus of the posterior horn on the right side. Lateral meniscus was unremarkable. The ligaments were on injured. He had no significant arthritis in the joint. Patient continues to have some left-sided hip pain, but this is not as bad as his low back. He previously responded to right sacroiliac injection, left sacroiliac was never injected.  Pain Inventory Average Pain 7 Pain Right Now 7 My pain is stabbing and aching  In the last 24 hours, has pain interfered with the following? General activity 8 Relation with others 7 Enjoyment of life 8 What TIME of day is your pain at its worst? morning, evening, night Sleep (in general) Fair  Pain is worse with: walking, bending, sitting and standing Pain improves with: rest and medication Relief from Meds: 8  Mobility walk with assistance use a cane how many minutes can you walk? 5 ability to climb steps?  yes do you drive?  no  Function disabled: date disabled . I need assistance with the following:  household duties  Neuro/Psych weakness numbness trouble walking  Prior Studies Any changes since last visit?  no  Physicians involved in your care Any changes since last visit?  no   Family History  Problem Relation Age of Onset  . Cancer Father     prostate and colon   Social History   Social History  . Marital status: Divorced    Spouse name: N/A  . Number of children: N/A  . Years of education: N/A   Social History Main Topics  . Smoking status: Current Every Day Smoker    Packs/day: 1.00    Types: Cigars  . Smokeless tobacco: Never Used  . Alcohol use 4.2 oz/week    7 Cans of beer per week     Comment: 1 beer per day  . Drug use: No  .  Sexual activity: Not Asked   Other Topics Concern  . None   Social History Narrative  . None   Past Surgical History:  Procedure Laterality Date  . ACHILLES TENDON SURGERY Bilateral 2002  . HERNIA REPAIR  [id number]   umbilical  . HIP SURGERY Left 2009  . JOINT REPLACEMENT  2009   hip   Past Medical History:  Diagnosis Date  . Allergy   . Arthritis   . GERD (gastroesophageal reflux disease)   . Hyperlipidemia    BP 129/85   Pulse (!) 112   Resp 14   SpO2 (!) 89%   Opioid Risk Score:   Fall Risk Score:  `1  Depression screen PHQ 2/9  Depression screen [facility] 2/9 12/20/2015 09/07/2015 04/26/2015 03/26/2015  Decreased Interest 0 2 2 2   Down, Depressed, Hopeless 0 1 1 1   PHQ - 2 Score 0 3 3 3   Altered sleeping - 2 - 2  Tired, decreased energy - 2 - 2  Change in appetite - 0 - 0  Feeling bad or failure about yourself  - 0 - 0  Trouble concentrating - 0 - 0  Moving slowly or fidgety/restless - 0 - 0  Suicidal thoughts - 0 - 0  PHQ-9 Score - 7 - 7    Review of Systems  Eyes: Negative.  Respiratory: Negative.   Cardiovascular: Negative.   Gastrointestinal: Negative.   Endocrine: Negative.   Genitourinary: Negative.   Musculoskeletal: Positive for arthralgias, back pain and gait problem.  Skin: Negative.   Neurological: Positive for weakness and numbness.  Psychiatric/Behavioral: Negative.   All other systems reviewed and are negative.      Objective:   Physical Exam  Constitutional: He is oriented to person, place, and time. He appears well-developed and well-nourished.  HENT:  Head: Normocephalic and atraumatic.  Eyes: Conjunctivae and EOM are normal. Pupils are equal, round, and reactive to light.  Musculoskeletal:       Lumbar back: He exhibits decreased range of motion and tenderness. He exhibits no deformity.  Lumbar range or motion. Diminished with flexion, extension, lateral bending and rotation all approximately 50% of normal No directional  pain  Tenderness over bilateral P S I S area Less tenderness over the lumbar paraspinal area  Neurological: He is alert and oriented to person, place, and time.  Skin: Skin is warm and dry.  Psychiatric: He has a normal mood and affect. His behavior is normal. Judgment and thought content normal.  Nursing note and vitals reviewed.  Right knee. No evidence of effusion, there is pain with full flexion, no extensor lag, negative straight leg raising, is able to get full extension. No evidence of popliteal fullness. There is medial joint line tenderness. There is crepitus at the patellar area, but this is bilateral.       Assessment & Plan:  1. Sacroiliac pain, he's had good response to the right sacroiliac injections but now is feeling similar plane on the left side. We'll set up for bilateral injections. Mr. duration of her response. If short duration, may consider L5, S1, S2, S3 dorsal ramus injections and if positive result would recommend radiofrequency neurotomies  2. Right medial meniscus tear, chronic, would benefit from offloading orthosis Referral to [facility]

## 2016-11-13 ENCOUNTER — Other Ambulatory Visit: Payer: Self-pay | Admitting: Internal Medicine

## 2016-11-13 DIAGNOSIS — K219 Gastro-esophageal reflux disease without esophagitis: Secondary | ICD-10-CM

## 2016-11-21 ENCOUNTER — Ambulatory Visit: Payer: Medicaid Other | Admitting: Physical Medicine & Rehabilitation

## 2016-11-21 ENCOUNTER — Encounter: Payer: Medicaid Other | Attending: Physical Medicine & Rehabilitation

## 2016-11-21 DIAGNOSIS — F17290 Nicotine dependence, other tobacco product, uncomplicated: Secondary | ICD-10-CM | POA: Insufficient documentation

## 2016-11-21 DIAGNOSIS — K219 Gastro-esophageal reflux disease without esophagitis: Secondary | ICD-10-CM | POA: Insufficient documentation

## 2016-11-21 DIAGNOSIS — Z8042 Family history of malignant neoplasm of prostate: Secondary | ICD-10-CM | POA: Insufficient documentation

## 2016-11-21 DIAGNOSIS — Z800 Family history of malignant neoplasm of digestive organs: Secondary | ICD-10-CM | POA: Insufficient documentation

## 2016-11-21 DIAGNOSIS — M533 Sacrococcygeal disorders, not elsewhere classified: Secondary | ICD-10-CM | POA: Insufficient documentation

## 2016-11-21 DIAGNOSIS — E785 Hyperlipidemia, unspecified: Secondary | ICD-10-CM | POA: Insufficient documentation

## 2016-11-21 DIAGNOSIS — M1990 Unspecified osteoarthritis, unspecified site: Secondary | ICD-10-CM | POA: Insufficient documentation

## 2016-11-21 DIAGNOSIS — M23203 Derangement of unspecified medial meniscus due to old tear or injury, right knee: Secondary | ICD-10-CM | POA: Insufficient documentation

## 2016-11-21 DIAGNOSIS — Z96642 Presence of left artificial hip joint: Secondary | ICD-10-CM | POA: Insufficient documentation

## 2016-11-28 DIAGNOSIS — M25361 Other instability, right knee: Secondary | ICD-10-CM | POA: Diagnosis not present

## 2016-12-22 ENCOUNTER — Encounter: Payer: Medicaid Other | Attending: Physical Medicine & Rehabilitation

## 2016-12-22 ENCOUNTER — Ambulatory Visit: Payer: Medicaid Other | Admitting: Physical Medicine & Rehabilitation

## 2016-12-22 DIAGNOSIS — M23203 Derangement of unspecified medial meniscus due to old tear or injury, right knee: Secondary | ICD-10-CM | POA: Insufficient documentation

## 2016-12-22 DIAGNOSIS — Z800 Family history of malignant neoplasm of digestive organs: Secondary | ICD-10-CM | POA: Insufficient documentation

## 2016-12-22 DIAGNOSIS — E785 Hyperlipidemia, unspecified: Secondary | ICD-10-CM | POA: Insufficient documentation

## 2016-12-22 DIAGNOSIS — M1990 Unspecified osteoarthritis, unspecified site: Secondary | ICD-10-CM | POA: Insufficient documentation

## 2016-12-22 DIAGNOSIS — F17290 Nicotine dependence, other tobacco product, uncomplicated: Secondary | ICD-10-CM | POA: Insufficient documentation

## 2016-12-22 DIAGNOSIS — Z96642 Presence of left artificial hip joint: Secondary | ICD-10-CM | POA: Insufficient documentation

## 2016-12-22 DIAGNOSIS — Z8042 Family history of malignant neoplasm of prostate: Secondary | ICD-10-CM | POA: Insufficient documentation

## 2016-12-22 DIAGNOSIS — K219 Gastro-esophageal reflux disease without esophagitis: Secondary | ICD-10-CM | POA: Insufficient documentation

## 2016-12-22 DIAGNOSIS — M533 Sacrococcygeal disorders, not elsewhere classified: Secondary | ICD-10-CM | POA: Insufficient documentation

## 2017-01-13 ENCOUNTER — Ambulatory Visit: Payer: Medicaid Other | Admitting: Physical Medicine & Rehabilitation

## 2017-01-16 ENCOUNTER — Other Ambulatory Visit: Payer: Self-pay | Admitting: Internal Medicine

## 2017-01-16 DIAGNOSIS — Z9109 Other allergy status, other than to drugs and biological substances: Secondary | ICD-10-CM

## 2017-01-16 NOTE — Telephone Encounter (Signed)
Patient has not been seen in our clinic for over a year. Can we please get him scheduled for a follow-up appointment? Thank you!

## 2017-01-19 NOTE — Telephone Encounter (Signed)
LM for patient on identified VM asking him to call back and schedule an appointment to discuss medication refills. Kaivon Livesey,CMA

## 2017-01-28 ENCOUNTER — Encounter: Payer: Self-pay | Admitting: Internal Medicine

## 2017-01-28 ENCOUNTER — Ambulatory Visit (INDEPENDENT_AMBULATORY_CARE_PROVIDER_SITE_OTHER): Payer: Medicaid Other | Admitting: Internal Medicine

## 2017-01-28 VITALS — BP 122/88 | HR 102 | Temp 98.0°F | Ht 66.0 in | Wt 254.0 lb

## 2017-01-28 DIAGNOSIS — E782 Mixed hyperlipidemia: Secondary | ICD-10-CM | POA: Diagnosis not present

## 2017-01-28 DIAGNOSIS — Z1211 Encounter for screening for malignant neoplasm of colon: Secondary | ICD-10-CM | POA: Diagnosis not present

## 2017-01-28 DIAGNOSIS — Z9109 Other allergy status, other than to drugs and biological substances: Secondary | ICD-10-CM | POA: Diagnosis not present

## 2017-01-28 DIAGNOSIS — M23321 Other meniscus derangements, posterior horn of medial meniscus, right knee: Secondary | ICD-10-CM

## 2017-01-28 DIAGNOSIS — H539 Unspecified visual disturbance: Secondary | ICD-10-CM

## 2017-01-28 MED ORDER — DICLOFENAC SODIUM 1 % TD GEL: 2.0000 g | Freq: Four times a day (QID) | TRANSDERMAL | 0 refills | Status: DC

## 2017-01-28 MED ORDER — FLUTICASONE PROPIONATE 50 MCG/ACT NA SUSP: 2.0000 | Freq: Every day | NASAL | 6 refills | Status: DC

## 2017-01-28 MED ORDER — SIMVASTATIN 40 MG PO TABS: 40.0000 mg | ORAL_TABLET | Freq: Every day | ORAL | 3 refills | Status: DC

## 2017-01-28 NOTE — Assessment & Plan Note (Signed)
Vision has been getting progressively more blurry. Wears glasses, but they are no longer working. Has not seen an eye doctor in 4 years. Would like a referral to get new glasses. No red flags. - Referral placed to Optometry.

## 2017-01-28 NOTE — Patient Instructions (Signed)
It was so nice to see you!  I have referred you to have a colonoscopy done. If you have issues with transportation, please give [place] a call and we can help get you a ride.  I have also referred you to optometry to get new glasses.  For your allergies, I have added Flonase, which is a nasal spray. Make sure you tilt this towards the outside when you spray it in your nose.  I have also increased the dose of your Zocor. Please take this once daily.  We will see you back in 6 months or earlier if needed.  -Dr. Brett Albino

## 2017-01-28 NOTE — Assessment & Plan Note (Signed)
Uncontrolled. Worse this time of year.  - Continue Claritin - Will add on Flonase 2 sprays daily - Follow-up in 6 months

## 2017-01-28 NOTE — Assessment & Plan Note (Signed)
Per patient, he had an MRI done that showed a meniscal tear. Currently being followed at [facility]. Conservative management for now, but may need to undergo surgery if pain persists. Voltaren gel and knee brace are helping. - Refilled Voltaren gel.

## 2017-01-28 NOTE — Progress Notes (Signed)
[facility] Phone: [phone]  Subjective:  Mark Hale is a 56 year old male presenting to clinic for routine follow-up for allergies, colon cancer screening, needing new glasses, chronic right knee pain, and HLD.  Seasonal Allergies: Have been bad this time of year. Using Claritin daily, which is helping. Having nasal congestion and some post-nasal drip. No facial pain, no fevers.  Colon Cancer Screening: We referred him to have a colonoscopy done last year. He states he missed the appointment because he couldn't get a ride. Last colonoscopy was when he was 56 years old. Per patient, he had polyps and he needed a repeat colonoscopy in 5 years. Father had colon cancer.  Needs new glasses: Patient has not seen an eye doctor in 4 years. He feels like his current glasses aren't working anymore. He has noticed gradual worsening in his vision. He is unable to read signs on the wall that he was previously able to read. No eye pain, no loss of visual fields, no temporal pain.  Chronic Right Knee Pain: Has been going on for 1 year. He fell this year and rolled down a hill. He was seen by orthopedic surgery ([facility]) and had an MRI that showed he tore his meniscus. He is trying conservative therapy for now, but may be heading towards surgery. He needs a refill for his Voltaren gel. Voltaren gel is helping.  HLD: Has not taken his Zocor since the end of March because he ran out. No chest pain, no shortness of breath. Was previously tolerating Zocor without any issues. No muscle aches.  ROS: See HPI for pertinent positives and negatives  Past Medical History- GERD, SI joint disease, chronic low back pain, seasonal allergies, HLD  Family history- father had colon cancer and prostate cancer  Social history- patient is a current smoker  Objective: BP 122/88   Pulse (!) 102   Temp 98 F (36.7 C) (Oral)   Ht 5\' 6"  (1.676 m)   Wt 254 lb (115.2 kg)   SpO2 98%    BMI 41.00 kg/m  Gen: NAD, alert, cooperative with exam HEENT: NCAT, EOMI, MMM Neck: FROM, supple CV: RRR, no murmur Resp: CTABL, no wheezes, normal work of breathing Msk: right knee without any erythema or edema, no edema in the feet/ankles bilaterally. Decreased ROM of the right knee secondary to pain. Neuro: Alert and oriented, no gross deficits, walks with a limp on the right side. Skin: No rashes, no lesions Psych: Appropriate behavior  Assessment/Plan: Seasonal Allergies: Uncontrolled. Worse this time of year.  - Continue Claritin - Will add on Flonase 2 sprays daily - Follow-up in 6 months  Colon Cancer Screening: Overdue for colonoscopy. Last colonoscopy was when he was 46 and showed polyps. Needed repeat colonoscopy in 5 years, but he has not had this done. Missed appointment for colonoscopy last year due to transportation issues. - GI referral placed for colonoscopy - Advised patient to call our clinic if he has issues with getting to this appointment. Can get SW involved if needed.  Vision Changes: Vision has been getting progressively more blurry. Wears glasses, but they are no longer working. Has not seen an eye doctor in 4 years. Would like a referral to get new glasses. No red flags. - Referral placed to Optometry.  R meniscal tear: Per patient, he had an MRI done that showed a meniscal tear. Currently being followed at [facility]. Conservative management for now, but may need to undergo surgery if pain persists.  Voltaren gel and knee brace are helping. - Refilled Voltaren gel.  HLD: Has not taken his Zocor in 1 month. Last lipid panel 11/2015- Chol 172, HDL 34, LDL 119, TG 96. - Increase Zocor dose from 20mg  daily to 40mg  daily - Will plan to repeat lipid panel in 6 months   Hyman Bible, MD PGY-2

## 2017-01-28 NOTE — Assessment & Plan Note (Signed)
Has not taken his Zocor in 1 month. Last lipid panel 11/2015- Chol 172, HDL 34, LDL 119, TG 96. - Increase Zocor dose from 20mg  daily to 40mg  daily - Will plan to repeat lipid panel in 6 months

## 2017-01-28 NOTE — Assessment & Plan Note (Signed)
Overdue for colonoscopy. Last colonoscopy was when he was 33 and showed polyps. Needed repeat colonoscopy in 5 years, but he has not had this done. Missed appointment for colonoscopy last year due to transportation issues. - GI referral placed for colonoscopy - Advised patient to call our clinic if he has issues with getting to this appointment. Can get SW involved if needed.

## 2017-02-03 ENCOUNTER — Ambulatory Visit: Payer: Medicaid Other | Admitting: Physical Medicine & Rehabilitation

## 2017-02-03 DIAGNOSIS — Z96642 Presence of left artificial hip joint: Secondary | ICD-10-CM | POA: Insufficient documentation

## 2017-02-03 DIAGNOSIS — M23203 Derangement of unspecified medial meniscus due to old tear or injury, right knee: Secondary | ICD-10-CM | POA: Insufficient documentation

## 2017-02-03 DIAGNOSIS — M533 Sacrococcygeal disorders, not elsewhere classified: Secondary | ICD-10-CM | POA: Insufficient documentation

## 2017-02-03 DIAGNOSIS — Z8042 Family history of malignant neoplasm of prostate: Secondary | ICD-10-CM | POA: Insufficient documentation

## 2017-02-03 DIAGNOSIS — M1990 Unspecified osteoarthritis, unspecified site: Secondary | ICD-10-CM | POA: Insufficient documentation

## 2017-02-03 DIAGNOSIS — E785 Hyperlipidemia, unspecified: Secondary | ICD-10-CM | POA: Insufficient documentation

## 2017-02-03 DIAGNOSIS — K219 Gastro-esophageal reflux disease without esophagitis: Secondary | ICD-10-CM | POA: Insufficient documentation

## 2017-02-03 DIAGNOSIS — Z800 Family history of malignant neoplasm of digestive organs: Secondary | ICD-10-CM | POA: Insufficient documentation

## 2017-02-03 DIAGNOSIS — F17290 Nicotine dependence, other tobacco product, uncomplicated: Secondary | ICD-10-CM | POA: Insufficient documentation

## 2017-02-10 ENCOUNTER — Ambulatory Visit: Payer: Medicaid Other | Admitting: Physical Medicine & Rehabilitation

## 2017-02-11 ENCOUNTER — Telehealth: Payer: Self-pay | Admitting: Licensed Clinical Social Worker

## 2017-02-11 NOTE — Progress Notes (Signed)
LCSW returned phone call  [phone] from patient reference transportation to his medical appointment Friday.  Patient has an appointment Friday with his "pain medicine doctor". States he was told to call me for assistance with transportation.  Patient has Medicaid and is able to utilize [employer] services to and from all medical appointments.  LCSW reviewed process with patient and provided the [employer] phone number. Also provided phone number to [employer].  Plan:   1. Patient will call to schedule his ride.   2. Patient call LCSW if he has any problems.   Casimer Lanius, LCSW Licensed Clinical Social Worker [facility]   [phone] 9:38 AM

## 2017-02-13 ENCOUNTER — Encounter: Payer: Self-pay | Admitting: Physical Medicine & Rehabilitation

## 2017-02-13 ENCOUNTER — Encounter: Payer: Medicaid Other | Attending: Physical Medicine & Rehabilitation

## 2017-02-13 ENCOUNTER — Ambulatory Visit (HOSPITAL_BASED_OUTPATIENT_CLINIC_OR_DEPARTMENT_OTHER): Payer: Medicaid Other | Admitting: Physical Medicine & Rehabilitation

## 2017-02-13 VITALS — BP 134/94 | HR 100 | Resp 16

## 2017-02-13 DIAGNOSIS — Z8042 Family history of malignant neoplasm of prostate: Secondary | ICD-10-CM | POA: Diagnosis not present

## 2017-02-13 DIAGNOSIS — F17290 Nicotine dependence, other tobacco product, uncomplicated: Secondary | ICD-10-CM | POA: Diagnosis not present

## 2017-02-13 DIAGNOSIS — M47816 Spondylosis without myelopathy or radiculopathy, lumbar region: Secondary | ICD-10-CM | POA: Diagnosis not present

## 2017-02-13 DIAGNOSIS — M545 Low back pain: Secondary | ICD-10-CM | POA: Diagnosis present

## 2017-02-13 DIAGNOSIS — K219 Gastro-esophageal reflux disease without esophagitis: Secondary | ICD-10-CM | POA: Diagnosis not present

## 2017-02-13 DIAGNOSIS — M1990 Unspecified osteoarthritis, unspecified site: Secondary | ICD-10-CM | POA: Diagnosis not present

## 2017-02-13 DIAGNOSIS — Z800 Family history of malignant neoplasm of digestive organs: Secondary | ICD-10-CM | POA: Diagnosis not present

## 2017-02-13 DIAGNOSIS — E785 Hyperlipidemia, unspecified: Secondary | ICD-10-CM | POA: Diagnosis not present

## 2017-02-13 DIAGNOSIS — Z96642 Presence of left artificial hip joint: Secondary | ICD-10-CM | POA: Diagnosis not present

## 2017-02-13 DIAGNOSIS — M533 Sacrococcygeal disorders, not elsewhere classified: Secondary | ICD-10-CM | POA: Diagnosis not present

## 2017-02-13 DIAGNOSIS — M23203 Derangement of unspecified medial meniscus due to old tear or injury, right knee: Secondary | ICD-10-CM | POA: Diagnosis not present

## 2017-02-13 NOTE — Patient Instructions (Signed)

## 2017-02-13 NOTE — Progress Notes (Signed)
  PROCEDURE RECORD  Physical Medicine and Rehabilitation   Name: Mark Hale DOB:[birth year] MRN: [id number]  Date:02/13/2017  Physician: Alysia Penna, MD    Nurse/CMA: Maylen Waltermire, CMA  Allergies: No Known Allergies  Consent Signed: Yes.    Is patient diabetic? No.  CBG today? n/a  Pregnant: No. LMP: No LMP for male patient. (age 58-55)  Anticoagulants: no Anti-inflammatory: no Antibiotics: no  Procedure: bilateral medial branch block Position: Prone Start Time: 9:41am  End Time:   Fluoro Time:   RN/CMA Shritha Bresee, CMA Linzie Boursiquot, CMA    Time 9:25am 10:00am    BP 134/94 159/94    Pulse 100 98    Respirations 14 14    O2 Sat 93 95    S/S 6 6    Pain Level 8/10 5/10     D/C home with sister, patient A & O X 3, D/C instructions reviewed, and sits independently.

## 2017-02-13 NOTE — Progress Notes (Signed)
Bilateral Lumbar L4  medial branch blocks and L 5 dorsal ramus injection under fluoroscopic guidance  Indication: Lumbar pain which is not relieved by medication management or other conservative care and interfering with self-care and mobility.  Informed consent was obtained after describing risks and benefits of the procedure with the patient, this includes bleeding, infection, paralysis and medication side effects.  The patient wishes to proceed and has given written consent.  The patient was placed in prone position.  The lumbar area was marked and prepped with Betadine.  One mL of 1% lidocaine was injected into each of 6 areas into the skin and subcutaneous tissue.  Then a 22-gauge 5" spinal needle was inserted targeting the junction of the left S1 superior articular process and sacral ala junction. Needle was advanced under fluoroscopic guidance.  Bone contact was made.  Isovue 200 was injected x 0.5 mL demonstrating no intravascular uptake.  Then a solution  of 2% MPF lidocaine was injected x 0.5 mL.  Then the left L5 superior articular process in transverse process junction was targeted.  Bone contact was made.  Isovue 200 was injected x 0.5 mL demonstrating no intravascular uptake. Then a solution containing  2% MPF lidocaine was injected x 0.5 mL.  This same procedure was performed on the right side using the same needle, technique and injectate.  Patient tolerated procedure well.  Post procedure instructions were given.

## 2017-04-10 ENCOUNTER — Encounter: Payer: Medicaid Other | Attending: Physical Medicine & Rehabilitation

## 2017-04-10 ENCOUNTER — Ambulatory Visit (HOSPITAL_BASED_OUTPATIENT_CLINIC_OR_DEPARTMENT_OTHER): Payer: Medicaid Other | Admitting: Physical Medicine & Rehabilitation

## 2017-04-10 ENCOUNTER — Encounter: Payer: Self-pay | Admitting: Physical Medicine & Rehabilitation

## 2017-04-10 VITALS — BP 132/92 | HR 104 | Resp 16

## 2017-04-10 DIAGNOSIS — E785 Hyperlipidemia, unspecified: Secondary | ICD-10-CM | POA: Diagnosis not present

## 2017-04-10 DIAGNOSIS — M1990 Unspecified osteoarthritis, unspecified site: Secondary | ICD-10-CM | POA: Insufficient documentation

## 2017-04-10 DIAGNOSIS — M533 Sacrococcygeal disorders, not elsewhere classified: Secondary | ICD-10-CM | POA: Diagnosis not present

## 2017-04-10 DIAGNOSIS — F17290 Nicotine dependence, other tobacco product, uncomplicated: Secondary | ICD-10-CM | POA: Insufficient documentation

## 2017-04-10 DIAGNOSIS — M23203 Derangement of unspecified medial meniscus due to old tear or injury, right knee: Secondary | ICD-10-CM | POA: Insufficient documentation

## 2017-04-10 DIAGNOSIS — K219 Gastro-esophageal reflux disease without esophagitis: Secondary | ICD-10-CM | POA: Diagnosis not present

## 2017-04-10 DIAGNOSIS — Z8042 Family history of malignant neoplasm of prostate: Secondary | ICD-10-CM | POA: Diagnosis not present

## 2017-04-10 DIAGNOSIS — M545 Low back pain: Secondary | ICD-10-CM | POA: Diagnosis present

## 2017-04-10 DIAGNOSIS — Z96642 Presence of left artificial hip joint: Secondary | ICD-10-CM | POA: Diagnosis not present

## 2017-04-10 DIAGNOSIS — Z800 Family history of malignant neoplasm of digestive organs: Secondary | ICD-10-CM | POA: Insufficient documentation

## 2017-04-10 MED ORDER — TRAMADOL HCL 50 MG PO TABS: 100.0000 mg | ORAL_TABLET | Freq: Three times a day (TID) | ORAL | 5 refills | Status: DC | PRN

## 2017-04-10 NOTE — Patient Instructions (Signed)
Sacroiliac injection was performed today. A combination of a naming medicine plus a cortisone medicine was injected. The injection was done under x-ray guidance. This procedure has been performed to help reduce low back and buttocks pain as well as potentially hip pain. The duration of this injection is variable lasting from hours to  Months. It may repeated if needed. 

## 2017-04-10 NOTE — Progress Notes (Signed)
  PROCEDURE RECORD [facility]   Name: Mark Hale DOB:28-Sep-1961 MRN: [id number]  Date:04/10/2017  Physician: Alysia Penna, MD    Nurse/CMA: Meranda Dechaine, CMA   Allergies: No Known Allergies  Consent Signed: Yes.    Is patient diabetic? No.  CBG today? N/A  Pregnant: No. LMP: No LMP for male patient. (age 58-55)  Anticoagulants: no Anti-inflammatory: no Antibiotics: no  Procedure: Bilateral sacroiliac steroid injection  Position: Prone Start Time: 9:30am  End Time: 9:40am  Fluoro Time: 42   RN/CMA Sajid Ruppert, CMA Ronel Rodeheaver, CMA    Time 9:15am 9:44 am    BP 132/92 151/94    Pulse 104 101    Respirations 16 16    O2 Sat 97 97    S/S 6 6    Pain Level 8/10 4/10     D/C home with medical transportation, patient A & O X 3, D/C instructions reviewed, and sits independently.

## 2017-04-10 NOTE — Progress Notes (Signed)
Bilateral sacroiliac injections under fluoroscopic guidance  Indication: Low back and buttocks pain not relieved by medication management and other conservative care.  Informed consent was obtained after describing risks and benefits of the procedure with the patient, this includes bleeding, bruising, infection, paralysis and medication side effects. The patient wishes to proceed and has given written consent. The patient was placed in a prone position. The lumbar and sacral area was marked and prepped with Betadine. A 25-gauge 1-1/2 inch needle was inserted into the skin and subcutaneous tissue and 1 mL of 1% lidocaine was injected into each side. Then a 25-gauge 3.5 inch spinal needle was inserted under fluoroscopic guidance into the left sacroiliac joint. AP and lateral images were utilized. Omnipaque 180x0.5 mL under live fluoroscopy demonstrated no intravascular uptake. Then a solution containing one ML of 6 mg per mL Celestone in 2 ML of 2% lidocaine MPF was injected x1.5 mL. This same procedure was repeated on the right side using the same needle, injectate, and technique. Patient tolerated the procedure well. Post procedure instructions were given. Please see post procedure form. 

## 2017-05-14 ENCOUNTER — Other Ambulatory Visit: Payer: Self-pay | Admitting: Physical Medicine & Rehabilitation

## 2017-05-14 NOTE — Telephone Encounter (Signed)
Recieved electronic medication refill request for tramadol for this patient, no mention to continue this medication in last notes, please advise

## 2017-05-22 ENCOUNTER — Ambulatory Visit: Payer: Medicaid Other | Admitting: Physical Medicine & Rehabilitation

## 2017-06-15 ENCOUNTER — Other Ambulatory Visit: Payer: Self-pay | Admitting: Physical Medicine & Rehabilitation

## 2017-06-19 ENCOUNTER — Telehealth: Payer: Self-pay | Admitting: *Deleted

## 2017-06-19 NOTE — Telephone Encounter (Signed)
Will also forward to referral coordinator so she is aware. Anyra Kaufman,CMA

## 2017-06-19 NOTE — Telephone Encounter (Signed)
Patient no showed for appointment today at [place].  Derl Barrow, RN

## 2017-06-26 ENCOUNTER — Ambulatory Visit: Payer: Self-pay | Admitting: Physical Medicine & Rehabilitation

## 2017-07-07 ENCOUNTER — Ambulatory Visit: Payer: Self-pay | Admitting: Physical Medicine & Rehabilitation

## 2017-07-16 ENCOUNTER — Other Ambulatory Visit: Payer: Self-pay | Admitting: Physical Medicine & Rehabilitation

## 2017-07-17 ENCOUNTER — Telehealth: Payer: Self-pay

## 2017-07-17 ENCOUNTER — Other Ambulatory Visit: Payer: Self-pay | Admitting: Physical Medicine & Rehabilitation

## 2017-07-17 NOTE — Telephone Encounter (Signed)
Patient called requesting refill on tramadol, he is out of medication

## 2017-07-17 NOTE — Telephone Encounter (Signed)
I spoke with Mark Hale and he had to cancel some appts due to transportation issues but wants an appt. I will call in one month supply for him and he will need to make an appt to get further refills.  He says that he tried this morning and was told he had to wait for them to call him back.  It has been 3 months and he says he could use another injection. Is it ok to schedule?

## 2017-07-17 NOTE — Telephone Encounter (Signed)
There have been 10 cancellations and no-shows in the last 10 months, so we will not reschedule

## 2017-07-24 NOTE — Telephone Encounter (Signed)
Discharge letter sent.

## 2017-08-12 ENCOUNTER — Encounter: Payer: Self-pay | Admitting: Internal Medicine

## 2017-08-12 ENCOUNTER — Other Ambulatory Visit: Payer: Self-pay

## 2017-08-12 ENCOUNTER — Ambulatory Visit: Payer: Medicaid Other | Admitting: Internal Medicine

## 2017-08-12 DIAGNOSIS — M545 Low back pain, unspecified: Secondary | ICD-10-CM

## 2017-08-12 DIAGNOSIS — G8929 Other chronic pain: Secondary | ICD-10-CM

## 2017-08-12 DIAGNOSIS — Z23 Encounter for immunization: Secondary | ICD-10-CM

## 2017-08-12 MED ORDER — TRAMADOL HCL 50 MG PO TABS: ORAL_TABLET | ORAL | 0 refills | Status: DC

## 2017-08-12 NOTE — Patient Instructions (Signed)
It was so nice to see you!  I have given you a prescription for Tramadol.   We will need to see you every 3 months for future refills.  -Dr. Brett Albino

## 2017-08-12 NOTE — Assessment & Plan Note (Signed)
Due to DJD and SI joint disease. Most recent x-ray 2013 with mild L3-4 and L4-5 degenerative disc disease and bilateral L5-S1 facet DJD. Has been dismissed from [facility] due to too many missed appointments. [place] Controlled Substances Reporting System checked 08/12/17- no red flags. - Will refill tramadol 50 mg 2 tablets 3 times a day prn #180, 0 refills (same prescription he was getting at [place]) - Pain contract filled out today and placed in chart - Has lumbar x-ray ordered by PMR physician- encouraged patient to have this done - Told patient he will need to be seen every 3 months in our clinic if he wants to get his tramadol prescription from [place] - Check UDS at next visit
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
## 2017-09-09 ENCOUNTER — Other Ambulatory Visit: Payer: Self-pay | Admitting: Internal Medicine

## 2017-09-10 NOTE — Telephone Encounter (Signed)
Please let Mark Hale know that his prescription is ready to be picked up at the front desk.

## 2017-09-10 NOTE — Telephone Encounter (Signed)
Unable to leave message for patient as phone continued to ring and no voicemail was available. Jazmin Hartsell,CMA

## 2017-10-12 ENCOUNTER — Other Ambulatory Visit: Payer: Self-pay | Admitting: Internal Medicine

## 2017-10-13 NOTE — Telephone Encounter (Signed)
Prescription placed up front for patient to pick up. Thanks!

## 2017-10-13 NOTE — Telephone Encounter (Signed)
Patient informed.  Mark Hale,CMA  

## 2017-10-15 ENCOUNTER — Ambulatory Visit: Payer: Medicaid Other | Admitting: Internal Medicine

## 2017-10-26 ENCOUNTER — Ambulatory Visit: Payer: Medicaid Other | Admitting: Internal Medicine

## 2017-10-26 VITALS — BP 130/85 | HR 102 | Temp 98.1°F | Wt 255.8 lb

## 2017-10-26 DIAGNOSIS — G8929 Other chronic pain: Secondary | ICD-10-CM

## 2017-10-26 DIAGNOSIS — M545 Low back pain: Secondary | ICD-10-CM | POA: Diagnosis present

## 2017-10-26 DIAGNOSIS — E782 Mixed hyperlipidemia: Secondary | ICD-10-CM | POA: Diagnosis not present

## 2017-10-26 MED ORDER — TRAMADOL HCL 50 MG PO TABS: 100.0000 mg | ORAL_TABLET | Freq: Three times a day (TID) | ORAL | 0 refills | Status: DC

## 2017-10-26 MED ORDER — DICLOFENAC SODIUM 1 % TD GEL: 2.0000 g | Freq: Four times a day (QID) | TRANSDERMAL | 0 refills | Status: DC

## 2017-10-26 MED ORDER — SIMVASTATIN 40 MG PO TABS: 40.0000 mg | ORAL_TABLET | Freq: Every day | ORAL | 3 refills | Status: DC

## 2017-10-26 NOTE — Patient Instructions (Signed)
It was so nice to see you today! I'm glad everything is going well for you!  Please come back to see [place] in 3 months.  -Dr. Brett Albino

## 2017-10-26 NOTE — Progress Notes (Signed)
   [facility] Phone: [phone]  Subjective:  Mark Hale is a 57 year old male presenting to clinic for chronic pain management and HLD.  Chronic Pain Management: Taking Tramadol 100mg  tid prn for chronic low back pain. Previously being seen at a pain clinic, but was having transportation issues and could no longer been seen there. States the Tramadol works really well for him. Helps him to be able to function in his daily life. No constipation. No drowsiness.  HLD: Taking Zocor 40mg  daily. No RUQ pain, no muscle aches. No other side effects.   ROS: See HPI for pertinent positives and negatives  Past Medical History- chronic low back pain, GERD, HLD  Family history reviewed for today's visit. No changes.  Social history- patient is a current smoker  Objective: BP 130/85   Pulse (!) 102   Temp 98.1 F (36.7 C) (Oral)   Wt 255 lb 12.8 oz (116 kg)   SpO2 97%   BMI 41.29 kg/m  Gen: NAD, alert, cooperative with exam HEENT: NCAT, EOMI, MMM Neck: FROM, supple CV: RRR, no murmur Resp: CTABL, no wheezes, normal work of breathing Back: +mild tenderness to palpation of the paraspinal muscles in the lumbar spine, no midline tenderness, normal ROM Neuro: Normal gait  Assessment/Plan: Chronic Low Back Pain: Indication for chronic opioid: Chronic low back pain Medication and dose: Tramadol 100mg  tid prn # pills per month: 180 (takes 2 tablets tid prn) Last UDS date: obtained today Pain contract signed (Y/N): Yes- signed 09/04/2017 Date narcotic database last reviewed (include red flags): 10/26/17- no red flags  HLD: Last lipid panel 12/20/2015 with Chol 172, HDL 34, LDL 119, TG 96. - Continue Zocor 40mg  daily - Patient unable to stay to have lipid panel done due to transportation issues. Can repeat at next visit.  Hyman Bible, MD PGY-3

## 2017-10-26 NOTE — Progress Notes (Signed)
t

## 2017-10-27 NOTE — Assessment & Plan Note (Signed)
Last lipid panel 12/20/2015 with Chol 172, HDL 34, LDL 119, TG 96. - Continue Zocor 40mg  daily - Patient unable to stay to have lipid panel done due to transportation issues. Can repeat at next visit.

## 2017-10-27 NOTE — Assessment & Plan Note (Addendum)
Indication for chronic opioid: Chronic low back pain Medication and dose: Tramadol 100mg  tid prn # pills per month: 180 (takes 2 tablets tid prn) Last UDS date: obtained today Pain contract signed (Y/N): Yes- signed 09/04/2017 Date narcotic database last reviewed (include red flags): 10/26/17- no red flags

## 2017-10-31 LAB — TOXASSURE SELECT 13 (MW), URINE

## 2017-11-17 ENCOUNTER — Other Ambulatory Visit: Payer: Self-pay | Admitting: Internal Medicine

## 2017-11-17 DIAGNOSIS — K219 Gastro-esophageal reflux disease without esophagitis: Secondary | ICD-10-CM

## 2017-12-08 ENCOUNTER — Other Ambulatory Visit: Payer: Self-pay

## 2017-12-08 NOTE — Telephone Encounter (Signed)
Pt called nurse line to request refill of Tramadol. Pt call back [phone] Wallace Cullens, RN

## 2017-12-09 MED ORDER — TRAMADOL HCL 50 MG PO TABS: 100.0000 mg | ORAL_TABLET | Freq: Three times a day (TID) | ORAL | 0 refills | Status: DC

## 2017-12-22 ENCOUNTER — Other Ambulatory Visit: Payer: Self-pay | Admitting: Internal Medicine

## 2017-12-22 DIAGNOSIS — Z9109 Other allergy status, other than to drugs and biological substances: Secondary | ICD-10-CM

## 2018-01-05 ENCOUNTER — Other Ambulatory Visit: Payer: Self-pay | Admitting: Internal Medicine

## 2018-01-06 ENCOUNTER — Telehealth: Payer: Self-pay

## 2018-01-06 NOTE — Telephone Encounter (Signed)
Completed PA info in [place] [employer] for Tramadol. Status pending. Will recheck status in 24 hours. Deyonna Fitzsimmons, RN ([facility] RN)  

## 2018-01-06 NOTE — Telephone Encounter (Signed)
Received fax from [place] requesting prior authorization of Tramadol. Form placed in MD's box for completion along with Medicaid formulary.  Danley Danker, RN [facility] RN)

## 2018-01-08 NOTE — Telephone Encounter (Signed)
Prior approval for Tramadol completed via [place] [employer].  Med approved for 01/06/18 - 07/05/18  Prior approval # [id number]. [facility] pharmacy notified. Danley Danker, RN [facility] RN)

## 2018-02-05 ENCOUNTER — Other Ambulatory Visit: Payer: Self-pay | Admitting: Internal Medicine

## 2018-02-05 NOTE — Telephone Encounter (Signed)
Patient is scheduled to see PCP on 02-19-18. Girl Schissler,CMA

## 2018-02-19 ENCOUNTER — Ambulatory Visit: Payer: Medicaid Other | Admitting: Internal Medicine

## 2018-02-19 ENCOUNTER — Other Ambulatory Visit: Payer: Self-pay

## 2018-02-19 ENCOUNTER — Encounter: Payer: Self-pay | Admitting: Internal Medicine

## 2018-02-19 VITALS — BP 134/70 | HR 96 | Temp 98.0°F | Ht 66.0 in | Wt 268.0 lb

## 2018-02-19 DIAGNOSIS — Z6841 Body Mass Index (BMI) 40.0 and over, adult: Secondary | ICD-10-CM | POA: Diagnosis not present

## 2018-02-19 DIAGNOSIS — E785 Hyperlipidemia, unspecified: Secondary | ICD-10-CM

## 2018-02-19 DIAGNOSIS — Z9109 Other allergy status, other than to drugs and biological substances: Secondary | ICD-10-CM

## 2018-02-19 LAB — POCT GLYCOSYLATED HEMOGLOBIN (HGB A1C): Hemoglobin A1C: 6.3 % — AB (ref 4.0–5.6)

## 2018-02-19 MED ORDER — FLUTICASONE PROPIONATE 50 MCG/ACT NA SUSP: 2.0000 | Freq: Every day | NASAL | 6 refills | Status: DC

## 2018-02-19 MED ORDER — CETIRIZINE HCL 10 MG PO TABS: 10.0000 mg | ORAL_TABLET | Freq: Every day | ORAL | 0 refills | Status: DC

## 2018-02-19 NOTE — Patient Instructions (Signed)
It was so nice to see you today!  I have ordered some routine labs for you and I will call you with those results.  For your allergies- stop the Claritin and start taking Zyrtec every day. I have also refilled your Flonase (nasal spray). Please use 2 sprays per nostril daily.  We will see you back in 3 months!  -Dr. Brett Albino

## 2018-02-19 NOTE — Progress Notes (Signed)
   [place] [facility] Phone: [phone]  Subjective:  Mark Hale is a 57 year old male presenting to clinic with allergies and follow-up of his HLD.  Allergies: Have been bad over the last couple of weeks. Usually has uncontrolled allergies during the spring. Taking Claritin, which hasn't been helping. Has been having a lot of sneezing and watery eyes. No purulent nasal discharge, no fevers, no chills.  HLD: Taking Zocor daily. No muscle aches, no RUQ pain. No side effects. No chest pain, no lower extremity edema.  Obesity: States he has been trying to lose weight. Has been eating more vegetables. Trying to walk a little bit, but states he could be doing a better job with this.  ROS: See HPI for pertinent positives and negatives  Past Medical History- HLD, chronic back pain, GERD, seasonal allergies  Family history reviewed for today's visit. No changes.  Social history- patient is a former smoker  Objective: BP 134/70   Pulse 96   Temp 98 F (36.7 C) (Oral)   Ht 5\' 6"  (1.676 m)   Wt 268 lb (121.6 kg)   SpO2 96%   BMI 43.26 kg/m  Gen: NAD, alert, cooperative with exam HEENT: NCAT, EOMI, MMM, nasal turbinates edematous Neck: FROM, supple CV: RRR, no murmur Resp: CTABL, no wheezes, normal work of breathing GI: SNTND, BS present, no guarding or organomegaly Msk: No edema, warm, normal tone, moves UE/LE spontaneously Neuro: Alert and oriented, no gross deficits Skin: No rashes, no lesions Psych: Appropriate behavior  Assessment/Plan: Seasonal Allergies: Uncontrolled. No signs of acute bacterial sinusitis. - Start Zyrtec and Flonase - Follow-up if no improvement  HLD: Taking Zocor daily. Last lipid panel 11/2015 with Chol 172, HDL 34, LDL 119, TG 96. - Check lipid panel and CMP - Continue Zocor  Obesity: BMI 43.3 - Discussed [place] plate diet and increasing amount of fruits and vegetables - Recommended exercising for 30 minutes at least 5 days per  week - Check A1c   Hyman Bible, MD PGY-3

## 2018-02-20 LAB — LIPID PANEL
Chol/HDL Ratio: 5.2 ratio — ABNORMAL HIGH (ref 0.0–5.0)
Cholesterol, Total: 202 mg/dL — ABNORMAL HIGH (ref 100–199)
HDL: 39 mg/dL — ABNORMAL LOW (ref >39)
LDL Calculated: 146 mg/dL — ABNORMAL HIGH (ref 0–99)
TRIGLYCERIDES: 87 mg/dL (ref 0–149)
VLDL CHOLESTEROL CAL: 17 mg/dL (ref 5–40)

## 2018-02-20 LAB — COMPREHENSIVE METABOLIC PANEL
A/G RATIO: 1.1 — AB (ref 1.2–2.2)
ALBUMIN: 3.9 g/dL (ref 3.5–5.5)
ALT: 54 IU/L — ABNORMAL HIGH (ref 0–44)
AST: 63 IU/L — ABNORMAL HIGH (ref 0–40)
Alkaline Phosphatase: 125 IU/L — ABNORMAL HIGH (ref 39–117)
BUN / CREAT RATIO: 9 (ref 9–20)
BUN: 8 mg/dL (ref 6–24)
Bilirubin Total: 0.5 mg/dL (ref 0.0–1.2)
CALCIUM: 9.3 mg/dL (ref 8.7–10.2)
CO2: 17 mmol/L — AB (ref 20–29)
CREATININE: 0.89 mg/dL (ref 0.76–1.27)
Chloride: 105 mmol/L (ref 96–106)
GFR, EST AFRICAN AMERICAN: 109 mL/min/{1.73_m2} (ref >59)
GFR, EST NON AFRICAN AMERICAN: 94 mL/min/{1.73_m2} (ref >59)
GLOBULIN, TOTAL: 3.5 g/dL (ref 1.5–4.5)
Glucose: 111 mg/dL — ABNORMAL HIGH (ref 65–99)
POTASSIUM: 4.6 mmol/L (ref 3.5–5.2)
Sodium: 138 mmol/L (ref 134–144)
Total Protein: 7.4 g/dL (ref 6.0–8.5)

## 2018-02-25 NOTE — Assessment & Plan Note (Signed)
Taking Zocor daily. Last lipid panel 11/2015 with Chol 172, HDL 34, LDL 119, TG 96. - Check lipid panel and CMP - Continue Zocor

## 2018-02-25 NOTE — Assessment & Plan Note (Signed)
BMI 43.3 - Discussed [place] plate diet and increasing amount of fruits and vegetables - Recommended exercising for 30 minutes at least 5 days per week - Check A1c

## 2018-02-25 NOTE — Assessment & Plan Note (Signed)
Uncontrolled. No signs of acute bacterial sinusitis. - Start Zyrtec and Flonase - Follow-up if no improvement

## 2018-03-09 ENCOUNTER — Other Ambulatory Visit: Payer: Self-pay | Admitting: Internal Medicine

## 2018-03-11 ENCOUNTER — Other Ambulatory Visit: Payer: Self-pay

## 2018-03-11 ENCOUNTER — Other Ambulatory Visit: Payer: Self-pay | Admitting: Internal Medicine

## 2018-03-11 MED ORDER — TRAMADOL HCL 50 MG PO TABS: 100.0000 mg | ORAL_TABLET | Freq: Three times a day (TID) | ORAL | 0 refills | Status: DC

## 2018-03-11 NOTE — Progress Notes (Signed)
Was last seen by Dr. Brett Albino for pain management in January 2019. Will provide 1 month refill of Tramadol but needs an appointment for further Rx.   Phill Myron, D.O. 03/11/2018, 12:11 PM PGY-3, [place]

## 2018-03-11 NOTE — Telephone Encounter (Signed)
Pt calling for refill of Tramadol. [facility] sent request on Tuesday. Pt is completely out of medicine. Please call to let him know when this has been sent to pharmacy. [phone]. Ottis Stain, CMA

## 2018-03-11 NOTE — Progress Notes (Signed)
appt made with pcp for 03-26-18. Jazmin Hartsell,CMA

## 2018-03-26 ENCOUNTER — Encounter: Payer: Self-pay | Admitting: Internal Medicine

## 2018-03-26 ENCOUNTER — Ambulatory Visit: Payer: Medicaid Other | Admitting: Internal Medicine

## 2018-03-26 ENCOUNTER — Other Ambulatory Visit: Payer: Self-pay

## 2018-03-26 VITALS — BP 125/70 | HR 75 | Temp 97.8°F | Wt 270.0 lb

## 2018-03-26 DIAGNOSIS — M545 Low back pain: Secondary | ICD-10-CM

## 2018-03-26 DIAGNOSIS — G8929 Other chronic pain: Secondary | ICD-10-CM

## 2018-03-26 DIAGNOSIS — R740 Nonspecific elevation of levels of transaminase and lactic acid dehydrogenase [LDH]: Secondary | ICD-10-CM | POA: Diagnosis not present

## 2018-03-26 DIAGNOSIS — R7401 Elevation of levels of liver transaminase levels: Secondary | ICD-10-CM

## 2018-03-26 NOTE — Assessment & Plan Note (Signed)
Labs from 02/19/18 showed AST of 63 and ALT 54. Not using Tylenol. No excessive alcohol intake. No history of liver issues. Likely fatty liver disease, given morbid obesity. - Will recheck CMP - Will also obtain hepatitis panel and ferritin

## 2018-03-26 NOTE — Assessment & Plan Note (Signed)
Indication for chronic opioid: chronic back pain Medication and dose: Tramadol 100mg  tid prn # pills per month: 180 Last UDS date: 10/26/17 Opioid Treatment Agreement signed (Y/N): yes- signed 09/04/17 [place] reviewed this encounter (include red flags):  Unable to access system today- no red flags in the past.

## 2018-03-26 NOTE — Patient Instructions (Signed)
It was so nice to see you today. Your labs showed some liver injury, so we have ordered some more labs to look into this further.   We will see you back in clinic in three months.   - Dr. Brett Albino, MD

## 2018-03-26 NOTE — Progress Notes (Signed)
   [facility] Phone: [phone]  Subjective:  Jamon is a 57 year old male presenting to clinic for follow-up of his chronic pain and to discuss his elevated liver function tests.  Chronic Pain: Located in his lower back. States that his back pain is worse with the rainy weather recently. No lower extremity weakness, no numbness, no tingling. No pain that radiates down his legs. Taking Advil and Tramadol, which are helping. Also using heat to the back, which helps. He is exercising for 30 minutes to 1.5 hours daily.  Elevated Transaminases: Had labs done 02/19/18 that showed mildly elevated AST and ALT. Denies any RUQ pain. No yellowing of his skin or eyes. He does not use Tylenol ever. Drinking two 40oz beers 1-2 times per week. Has never had any liver issues in the past.  ROS: See HPI for pertinent positives and negatives  Past Medical History- chronic low back pain, GERD, severe obesity, allergies, HLD  Family history reviewed for today's visit. No changes.  Social history- patient is a former smoker  Objective: BP 125/70   Pulse 75   Temp 97.8 F (36.6 C) (Oral)   Wt 270 lb (122.5 kg)   SpO2 93%   BMI 43.58 kg/m  Gen: NAD, alert, cooperative with exam HEENT: NCAT, EOMI, MMM Neck: FROM, supple CV: RRR, no murmur Resp: CTABL, no wheezes, normal work of breathing Back: normal ROM, +tenderness to palpation of the paraspinal muscles of the lumbar spine, no midline tenderness. Msk: No edema, warm, normal tone, moves UE/LE spontaneously Neuro: Alert and oriented, no gross deficits, straight leg raise negative, sensation intact to light touch throughout lower extremities, 5/5 muscle strength in the lower extremities, normal gait. Skin: No rashes, no lesions Psych: Appropriate behavior  Assessment/Plan: Chronic Back Pain: Indication for chronic opioid: chronic back pain Medication and dose: Tramadol 100mg  tid prn # pills per month: 180 Last UDS  date: 10/26/17 Opioid Treatment Agreement signed (Y/N): yes- signed 09/04/17 NCCSRS reviewed this encounter (include red flags):  Unable to access system today- no red flags in the past.  Elevated Liver Transaminases: Labs from 02/19/18 showed AST of 63 and ALT 54. Not using Tylenol. No excessive alcohol intake. No history of liver issues. Likely fatty liver disease, given morbid obesity. - Will recheck CMP - Will also obtain hepatitis panel and ferritin   Hyman Bible, MD PGY-3

## 2018-03-27 LAB — HEPATITIS PANEL, ACUTE
HEP A IGM: NEGATIVE
HEP B S AG: NEGATIVE
Hep B C IgM: NEGATIVE
Hep C Virus Ab: <0.1 s/co ratio (ref 0.0–0.9)

## 2018-03-27 LAB — COMPREHENSIVE METABOLIC PANEL
A/G RATIO: 1.1 — AB (ref 1.2–2.2)
ALBUMIN: 3.9 g/dL (ref 3.5–5.5)
ALT: 64 IU/L — ABNORMAL HIGH (ref 0–44)
AST: 80 IU/L — ABNORMAL HIGH (ref 0–40)
Alkaline Phosphatase: 121 IU/L — ABNORMAL HIGH (ref 39–117)
BUN / CREAT RATIO: 9 (ref 9–20)
BUN: 8 mg/dL (ref 6–24)
Bilirubin Total: 0.5 mg/dL (ref 0.0–1.2)
CO2: 18 mmol/L — AB (ref 20–29)
CREATININE: 0.85 mg/dL (ref 0.76–1.27)
Calcium: 9.4 mg/dL (ref 8.7–10.2)
Chloride: 105 mmol/L (ref 96–106)
GFR calc Af Amer: 111 mL/min/{1.73_m2} (ref >59)
GFR calc non Af Amer: 96 mL/min/{1.73_m2} (ref >59)
GLOBULIN, TOTAL: 3.5 g/dL (ref 1.5–4.5)
Glucose: 99 mg/dL (ref 65–99)
POTASSIUM: 4.4 mmol/L (ref 3.5–5.2)
SODIUM: 137 mmol/L (ref 134–144)
Total Protein: 7.4 g/dL (ref 6.0–8.5)

## 2018-03-27 LAB — FERRITIN: FERRITIN: 200 ng/mL (ref 30–400)

## 2018-04-09 ENCOUNTER — Other Ambulatory Visit: Payer: Self-pay | Admitting: Family Medicine

## 2018-04-09 NOTE — Telephone Encounter (Signed)
Will forward to MD.  Patient already has an appointment scheduled on 06-03-18 with Dr. Ky Barban.  Arnika Larzelere,CMA

## 2018-04-09 NOTE — Telephone Encounter (Signed)
Needs refill on tramadol.  He runs out today.  [facility] [address].  Please call pt and let him know if it will be called in

## 2018-04-10 MED ORDER — TRAMADOL HCL 50 MG PO TABS: 100.0000 mg | ORAL_TABLET | Freq: Three times a day (TID) | ORAL | 0 refills | Status: DC

## 2018-05-07 ENCOUNTER — Other Ambulatory Visit: Payer: Self-pay | Admitting: Family Medicine
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
## 2018-06-03 ENCOUNTER — Ambulatory Visit: Payer: Medicaid Other | Admitting: Family Medicine

## 2018-06-04 ENCOUNTER — Ambulatory Visit: Payer: Medicaid Other | Admitting: Family Medicine

## 2018-06-04 ENCOUNTER — Other Ambulatory Visit: Payer: Self-pay

## 2018-06-04 ENCOUNTER — Encounter: Payer: Self-pay | Admitting: Family Medicine

## 2018-06-04 VITALS — BP 124/88 | HR 107 | Temp 98.3°F | Ht 66.0 in | Wt 272.0 lb

## 2018-06-04 DIAGNOSIS — M545 Low back pain: Secondary | ICD-10-CM

## 2018-06-04 DIAGNOSIS — G8929 Other chronic pain: Secondary | ICD-10-CM | POA: Diagnosis not present

## 2018-06-04 DIAGNOSIS — Z23 Encounter for immunization: Secondary | ICD-10-CM | POA: Diagnosis not present

## 2018-06-04 DIAGNOSIS — R740 Nonspecific elevation of levels of transaminase and lactic acid dehydrogenase [LDH]: Secondary | ICD-10-CM

## 2018-06-04 DIAGNOSIS — H04203 Unspecified epiphora, bilateral lacrimal glands: Secondary | ICD-10-CM

## 2018-06-04 DIAGNOSIS — Z1211 Encounter for screening for malignant neoplasm of colon: Secondary | ICD-10-CM

## 2018-06-04 DIAGNOSIS — R7401 Elevation of levels of liver transaminase levels: Secondary | ICD-10-CM

## 2018-06-04 MED ORDER — SIMVASTATIN 40 MG PO TABS: 40.0000 mg | ORAL_TABLET | Freq: Every day | ORAL | 3 refills | Status: DC

## 2018-06-04 MED ORDER — TRAMADOL HCL 50 MG PO TABS: ORAL_TABLET | ORAL | 0 refills | Status: DC

## 2018-06-04 NOTE — Assessment & Plan Note (Signed)
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 Does have some excess alcohol intake, encouraged cutting back on alcohol consumption and discussed weight loss. Will obtain RUQ U/S to determine fatty deposition.

## 2018-06-04 NOTE — Assessment & Plan Note (Signed)
No red flags. Could have allergic component although not bilateral and has not resolved with zyrtec or claritin. Chalazion present on L upper eyelid although not bothersome to patient could be causing some irritation with resultant tearing. Patient due for routine eye exam, will refer to optometry.

## 2018-06-04 NOTE — Assessment & Plan Note (Signed)
Well controlled on tramadol chronically. PMP reviewed today without red flags. Declined referral to neurosurgery. Will likely discuss tapering down tramadol dose at next visit.

## 2018-06-04 NOTE — Patient Instructions (Signed)
It was great to see you!  Our plans for today:  - We are obtaining an ultrasound to check for changes to your liver. - We are refilling your tramadol and simvastatin. - We are referring you for a colonoscopy and eye exam.  Take care and seek immediate care sooner if you develop any concerns.   Dr. [facility]

## 2018-06-16 DIAGNOSIS — H40013 Open angle with borderline findings, low risk, bilateral: Secondary | ICD-10-CM | POA: Diagnosis not present

## 2018-06-16 DIAGNOSIS — H04212 Epiphora due to excess lacrimation, left lacrimal gland: Secondary | ICD-10-CM | POA: Diagnosis not present

## 2018-06-16 DIAGNOSIS — H2513 Age-related nuclear cataract, bilateral: Secondary | ICD-10-CM | POA: Diagnosis not present

## 2018-06-16 DIAGNOSIS — H25013 Cortical age-related cataract, bilateral: Secondary | ICD-10-CM | POA: Diagnosis not present

## 2018-06-18 ENCOUNTER — Ambulatory Visit (HOSPITAL_COMMUNITY)
Admission: RE | Admit: 2018-06-18 | Discharge: 2018-06-18 | Disposition: A | Discharge status: Home / Self Care | Payer: Medicaid Other | Source: Ambulatory Visit | Attending: Family Medicine | Admitting: Family Medicine

## 2018-06-18 DIAGNOSIS — R740 Nonspecific elevation of levels of transaminase and lactic acid dehydrogenase [LDH]: Secondary | ICD-10-CM | POA: Diagnosis not present

## 2018-06-18 DIAGNOSIS — R7401 Elevation of levels of liver transaminase levels: Secondary | ICD-10-CM

## 2018-06-18 DIAGNOSIS — R7989 Other specified abnormal findings of blood chemistry: Secondary | ICD-10-CM | POA: Diagnosis not present

## 2018-06-29 ENCOUNTER — Other Ambulatory Visit: Payer: Self-pay | Admitting: *Deleted

## 2018-06-29 DIAGNOSIS — K219 Gastro-esophageal reflux disease without esophagitis: Secondary | ICD-10-CM

## 2018-06-29 MED ORDER — CETIRIZINE HCL 10 MG PO TABS: 10.0000 mg | ORAL_TABLET | Freq: Every day | ORAL | 3 refills | Status: AC

## 2018-06-29 MED ORDER — OMEPRAZOLE 20 MG PO CPDR: 20.0000 mg | DELAYED_RELEASE_CAPSULE | Freq: Every day | ORAL | 3 refills | Status: DC

## 2018-07-01 NOTE — Progress Notes (Signed)
  Subjective:   Patient ID: Mark Hale    DOB: [birth year], 59 y.o. male   MRN: [id number]  Mark Hale is a 57 y.o. male with a history of GERD, chronic LBP, allergies, obesity here for   Abnormal LFTs - Seen previously with negative hep panel, normal ferritin. With some excess alcohol intake, advised to decrease amount at last visit and discussed weight loss. Most likely etiology NAFLD. - Abd [place] 9/20 with increased echogenicity within the liver likely related to fatty infiltration. - Patient returns today for follow up. States he has been trying to eat more vegetables. Also decreased amount of alcohol, last drank one beer on Saturday.  - 24hr recall: got up at 6am, drank herbal tea with sugar, ate at 12noon fried chicken thigh without the skin, carrots with dipping sauce, crackers, water with lunch. Ate dinner at 5-6pm same thing for lunch. Drank tea after dinner. - Exercise: stretching, jumping jacks every morning.  - interested in nutrition referral.  Review of Systems:  Per HPI.  [place], medications and smoking status reviewed.  Objective:   BP (!) 150/85   Pulse (!) 116   Temp 98.2 F (36.8 C) (Oral)   Wt 272 lb (123.4 kg)   SpO2 92%   BMI 43.90 kg/m  Vitals and nursing note reviewed.  General: obese male, in no acute distress with non-toxic appearance HEENT: normocephalic, atraumatic, moist mucous membranes Lungs: no respiratory distress Skin: warm, dry, no rashes or lesions Extremities: warm and well perfused, normal tone MSK: ROM grossly intact, strength intact, gait normal Neuro: Alert and oriented, speech normal  Assessment & Plan:   Transaminitis With fatty infiltration seen on RUQ [place] most consistent with NAFLD. Congratulated patient on decreasing amount of alcohol consumption. Weight stable from prior visit one month ago. Encouraged continued lifestyle modifications and working to incorporate vegetables with regular meal times. Placed nutrition  referral. Advised more regular low-impact exercise given chronic back and knee pain.   Orders Placed This Encounter  Procedures  . Amb ref to Medical Nutrition Therapy-MNT    Referral Priority:   Routine    Referral Type:   Consultation    Referral Reason:   Specialty Services Required    Requested Specialty:   Nutrition    Number of Visits Requested:   1   Meds ordered this encounter  Medications  . traMADol (ULTRAM) 50 MG tablet    Sig: TAKE 2 TABLETS(100 MG) BY MOUTH THREE TIMES DAILY    Dispense:  180 tablet    Refill:  0    Please fill AFTER 06/09/18.   Rory Percy, DO PGY-2, [facility] 07/02/2018 4:59 PM

## 2018-07-02 ENCOUNTER — Encounter: Payer: Self-pay | Admitting: Family Medicine

## 2018-07-02 ENCOUNTER — Ambulatory Visit: Payer: Medicaid Other | Admitting: Family Medicine

## 2018-07-02 ENCOUNTER — Other Ambulatory Visit: Payer: Self-pay

## 2018-07-02 VITALS — BP 150/85 | HR 116 | Temp 98.2°F | Wt 272.0 lb

## 2018-07-02 DIAGNOSIS — Z6841 Body Mass Index (BMI) 40.0 and over, adult: Secondary | ICD-10-CM

## 2018-07-02 DIAGNOSIS — R740 Nonspecific elevation of levels of transaminase and lactic acid dehydrogenase [LDH]: Secondary | ICD-10-CM

## 2018-07-02 DIAGNOSIS — R7401 Elevation of levels of liver transaminase levels: Secondary | ICD-10-CM

## 2018-07-02 MED ORDER — TRAMADOL HCL 50 MG PO TABS: ORAL_TABLET | ORAL | 0 refills | Status: DC

## 2018-07-02 NOTE — Assessment & Plan Note (Addendum)
With fatty infiltration seen on RUQ [place] most consistent with NAFLD. Congratulated patient on decreasing amount of alcohol consumption. Weight stable from prior visit one month ago. Encouraged continued lifestyle modifications and working to incorporate vegetables with regular meal times. Placed nutrition referral. Advised more regular low-impact exercise given chronic back and knee pain.

## 2018-07-02 NOTE — Patient Instructions (Signed)
It was great to see you!   Our plans for today:  - We are referring you to nutrition to better help your progress. Please call our nutritionist, Dr. Jenne Campus, to set up an appointment. [phone]. - Work on incorporating more vegetables into your diet with regular meal times.    Take care and seek immediate care sooner if you develop any concerns.   Dr. [facility]  Here is an example of what a healthy plate looks like:    ? Make half your plate fruits and vegetables.     ? Focus on whole fruits.     ? Vary your veggies.  ? Make half your grains whole grains. -     ? Look for the word "whole" at the beginning of the ingredients list    ? Some whole-grain ingredients include whole oats, whole-wheat flour,        whole-grain corn, whole-grain brown rice, and whole rye.  ? Move to low-fat and fat-free milk or yogurt.  ? Vary your protein routine. - Meat, fish, poultry (chicken, [place]), eggs, beans (kidney, pinto), dairy.  ? Drink and eat less sodium, saturated fat, and added sugars.

## 2018-07-06 ENCOUNTER — Telehealth: Payer: Self-pay | Admitting: *Deleted

## 2018-07-06 NOTE — Telephone Encounter (Signed)
Received fax from [facility] requesting prior authorization of tramadol .  Form placed in MD's box for completion along with Medicaid formulary.  Fleeger, Salome Spotted, CMA    ** will need to attach office notes with form in [place]. Fleeger, Salome Spotted, CMA

## 2018-07-09 ENCOUNTER — Other Ambulatory Visit: Payer: Self-pay | Admitting: Family Medicine

## 2018-07-09 NOTE — Telephone Encounter (Signed)
Patient came in asking why his Tramadol has not been filled yet.  Please contact him asap # [phone].

## 2018-07-09 NOTE — Telephone Encounter (Signed)
Called patient to let him know. Verbalized understanding.

## 2018-07-09 NOTE — Telephone Encounter (Signed)
Will check with RN team.  Mark Hale

## 2018-07-09 NOTE — Telephone Encounter (Signed)
Completed PA info in [place] for Tramadol. Status pending. Will recheck status in 24 hours. Danley Danker, RN [facility] RN)

## 2018-07-12 NOTE — Telephone Encounter (Signed)
Prior approval for Tramadol completed via [place] [employer].  Med approved for 07/09/2018 - 01/05/2019  Prior approval # [id number].   [facility] pharmacy informed.  Sricharan Lacomb, Salome Spotted, CMA

## 2018-07-20 ENCOUNTER — Encounter: Payer: Self-pay | Admitting: Registered"

## 2018-07-20 ENCOUNTER — Encounter: Payer: Medicaid Other | Attending: Family Medicine | Admitting: Registered"

## 2018-07-20 DIAGNOSIS — Z713 Dietary counseling and surveillance: Secondary | ICD-10-CM | POA: Diagnosis not present

## 2018-07-20 DIAGNOSIS — Z6841 Body Mass Index (BMI) 40.0 and over, adult: Secondary | ICD-10-CM | POA: Diagnosis not present

## 2018-07-20 DIAGNOSIS — E669 Obesity, unspecified: Secondary | ICD-10-CM

## 2018-07-20 NOTE — Progress Notes (Signed)
  Medical Nutrition Therapy:  Appt start time: 8:55  end time: 9:30.  GERD Assessment:  Primary concerns today: Pt states he has started to eat more vegetables. Pt states his sister is a Marine scientist and tells him what to eat and not to drink beer. Pt states he wants to lose 30 lbs to be around 225 lbs because that is what he has gained since being in [place]. Pt states he moved here 11 years ago from [place]. Pt states he has a bad back and weight is causing back pain. Pt states when he lived in [place] he was busy and on-the-move and since being here he is not as mobile. Pt states he is disabled and does not work; no concerns for food insecurity. Pt states he utilizes his resources and has family support in the area.   Pt states he used to drink 5 40 oz beers/day and now drinking 2 40 ounces/day. Pt states he wants to decrease to 1 40 ounce/day then to down to once on the weekends. Pt states he allows grandchildren to choose dinner for him (ages 29 and 44) each night. Pt states he had surgery on both achilles tendons due to falling 3 flights of stairs; limits physical activity. Pt states he has been eating 2 meals/day lately and unable to lose weight.   Per recent labs: elevated A1c (6.3), elevated cholesterol (202), decreased HDL (39), and elevated LDL (146).   Next time: review barriers, physical activity  Pt expectations: none stated  Preferred Learning Style:   No preference indicated   Learning Readiness:   Ready  Change in progress   MEDICATIONS: See list   DIETARY INTAKE:  Usual eating pattern includes 2 meals and 1 snacks per day.  Everyday foods include baked chicken, [place], vegetables, sandwiches, rice and beans, water, and alcoholic beverages.  Avoided foods include red meat because it takes a long time for it to digest.    24-hr recall:  B ( AM): sandwich (sausage or egg + bacon or BLT) + beer Snk ( AM): none  L ( PM): typically skips Snk ( PM): none D ( PM): fried chicken/pork  chop/turkey wings + greens or italian sausage + peppers/onions + mustard greens + black beans + rice Snk ( PM): cookie Beverages: water, beer (2 40 ounces/day), juice (sometimes), herbal tea  Usual physical activity: walking   Estimated energy needs: 2000 calories 225 g carbohydrates 150 g protein 56 g fat  Progress Towards Goal(s):  In progress.   Nutritional Diagnosis:  NB-1.1 Food and nutrition-related knowledge deficit As related to lack of prior nutrition-related information on fiber.  As evidenced by pt reports incomplete information.    Intervention:  Nutrition education and counseling. Pt was educated and counseled on prediabetes, metabolism, eating to nourish his body, importance of decreasing alcohol intake, and increasing fiber intake. Pt was in agreement with goals listed.  Goals: - Aim to decreased alcohol intake to once a day.  - Try not to skip lunch.  - Increase fiber intake with vegetables at lunch and dinner.  - Great job increasing vegetable intake and decreasing alcohol intake! Keep up the great work!  Teaching Method Utilized:  Visual Auditory Hands on  Handouts given during visit include:  My Plate  Barriers to learning/adherence to lifestyle change: none identified  Demonstrated degree of understanding via:  Teach Back   Monitoring/Evaluation:  Dietary intake, exercise, and body weight in 1 month(s).

## 2018-07-20 NOTE — Patient Instructions (Addendum)
-   Aim to decreased alcohol intake to once a day.   - Try not to skip lunch.   - Increase fiber intake with vegetables at lunch and dinner.   - Great job increasing vegetable intake and decreasing alcohol intake! Keep up the great work!

## 2018-07-27 DIAGNOSIS — Z86010 Personal history of colonic polyps: Secondary | ICD-10-CM | POA: Diagnosis not present

## 2018-07-27 DIAGNOSIS — K921 Melena: Secondary | ICD-10-CM | POA: Diagnosis not present

## 2018-08-06 ENCOUNTER — Other Ambulatory Visit: Payer: Self-pay | Admitting: Family Medicine

## 2018-08-23 ENCOUNTER — Ambulatory Visit: Payer: Medicaid Other | Admitting: Registered"

## 2018-09-08 ENCOUNTER — Other Ambulatory Visit: Payer: Self-pay | Admitting: Family Medicine

## 2018-09-08 DIAGNOSIS — K921 Melena: Secondary | ICD-10-CM | POA: Diagnosis not present

## 2018-09-08 DIAGNOSIS — K648 Other hemorrhoids: Secondary | ICD-10-CM | POA: Diagnosis not present

## 2018-09-08 DIAGNOSIS — K5730 Diverticulosis of large intestine without perforation or abscess without bleeding: Secondary | ICD-10-CM | POA: Diagnosis not present

## 2018-09-08 NOTE — Telephone Encounter (Signed)
Pt would like to have his tramadol refilled. He is completely out and needs them asap.

## 2018-09-09 MED ORDER — TRAMADOL HCL 50 MG PO TABS: ORAL_TABLET | ORAL | 0 refills | Status: DC

## 2018-09-30 ENCOUNTER — Ambulatory Visit: Payer: Medicaid Other | Admitting: Family Medicine
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
## 2018-10-06 ENCOUNTER — Encounter: Payer: Self-pay | Admitting: Family Medicine

## 2018-10-06 ENCOUNTER — Other Ambulatory Visit: Payer: Self-pay

## 2018-10-06 ENCOUNTER — Telehealth: Payer: Self-pay

## 2018-10-06 ENCOUNTER — Ambulatory Visit: Payer: Medicaid Other | Admitting: Family Medicine

## 2018-10-06 VITALS — BP 128/78 | HR 109 | Temp 97.9°F | Wt 271.6 lb

## 2018-10-06 DIAGNOSIS — Z1211 Encounter for screening for malignant neoplasm of colon: Secondary | ICD-10-CM | POA: Diagnosis not present

## 2018-10-06 DIAGNOSIS — E782 Mixed hyperlipidemia: Secondary | ICD-10-CM

## 2018-10-06 DIAGNOSIS — R7303 Prediabetes: Secondary | ICD-10-CM | POA: Insufficient documentation

## 2018-10-06 DIAGNOSIS — M25521 Pain in right elbow: Secondary | ICD-10-CM

## 2018-10-06 DIAGNOSIS — G8929 Other chronic pain: Secondary | ICD-10-CM | POA: Insufficient documentation

## 2018-10-06 DIAGNOSIS — Z6841 Body Mass Index (BMI) 40.0 and over, adult: Secondary | ICD-10-CM

## 2018-10-06 DIAGNOSIS — G894 Chronic pain syndrome: Secondary | ICD-10-CM | POA: Diagnosis not present

## 2018-10-06 LAB — POCT GLYCOSYLATED HEMOGLOBIN (HGB A1C): HbA1c, POC (controlled diabetic range): 6.3 % (ref 0.0–7.0)

## 2018-10-06 MED ORDER — TRAMADOL HCL 50 MG PO TABS: ORAL_TABLET | ORAL | 0 refills | Status: DC

## 2018-10-06 MED ORDER — DICLOFENAC SODIUM 1 % TD GEL: 2.0000 g | Freq: Four times a day (QID) | TRANSDERMAL | 0 refills | Status: AC
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
## 2018-10-06 NOTE — Assessment & Plan Note (Signed)
Obtain lipid panel today.  Suspect will need to increase to a higher intensity statin.

## 2018-10-06 NOTE — Assessment & Plan Note (Addendum)
A1c 6.3 today.  Unchanged since May 2019, diet controlled.  Discussed continued lifestyle modifications through diet, exercise and losing weight.  We will recheck in 6 months, can consider initiating metformin at that time.

## 2018-10-06 NOTE — Patient Instructions (Addendum)
It was great to see you!  Our plans for today:  - We refilled your voltaren. - We are checking some labs today, we will call you or send you a letter if they are abnormal.  - Continue to work on losing weight with diet and exercise. We will check your a1c again in 6 months. - We refilled your tramadol. Try taking one pill three times per day instead of 2 pills. If needed, you can take an additional pill about 15mins to an hour after.  Take care and seek immediate care sooner if you develop any concerns.   Dr. [facility]

## 2018-10-06 NOTE — Assessment & Plan Note (Signed)
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  Previously seen nutrition and has worked to increase vegetable intake and cut down on alcoholic beverages.  Congratulated patient on efforts.  Continue to encourage lifestyle modifications through diet and exercise.

## 2018-10-06 NOTE — Assessment & Plan Note (Signed)
UDS obtained today.  PMP aware reviewed and without red flags.  Discussed taper of tramadol today, patient amenable to trying.  Will provide refill of tramadol with #175 tablets and instructed to take 1 pill 3 times a day and take second pill if needed after taking first.

## 2018-10-06 NOTE — Telephone Encounter (Signed)
Received fax from [facility] requesting prior authorization of Diclofenac gel. Prior approval completed via [place] Tracks.  Med approved for 10/06/18 - 10/06/19.  Prior approval # [id number].  [facility] pharmacy informed.  Esau Grew, RN

## 2018-10-07 ENCOUNTER — Encounter: Payer: Self-pay | Admitting: Family Medicine

## 2018-10-07 ENCOUNTER — Telehealth: Payer: Self-pay | Admitting: Family Medicine

## 2018-10-07 LAB — MICROALBUMIN / CREATININE URINE RATIO
Creatinine, Urine: 215.8 mg/dL
Microalb/Creat Ratio: 11.9 mg/g creat (ref 0.0–30.0)
Microalbumin, Urine: 25.6 ug/mL

## 2018-10-07 LAB — LIPID PANEL
Chol/HDL Ratio: 4.4 ratio (ref 0.0–5.0)
Cholesterol, Total: 189 mg/dL (ref 100–199)
HDL: 43 mg/dL (ref >39)
LDL CALC: 110 mg/dL — AB (ref 0–99)
Triglycerides: 182 mg/dL — ABNORMAL HIGH (ref 0–149)
VLDL Cholesterol Cal: 36 mg/dL (ref 5–40)

## 2018-10-07 MED ORDER — ROSUVASTATIN CALCIUM 20 MG PO TABS: 20.0000 mg | ORAL_TABLET | Freq: Every day | ORAL | 3 refills | Status: AC

## 2018-10-07 NOTE — Addendum Note (Signed)
Addended by: Myles Gip on: 10/07/2018 11:27 AM   Modules accepted: Orders

## 2018-10-08 ENCOUNTER — Telehealth: Payer: Self-pay | Admitting: Family Medicine

## 2018-10-08 NOTE — Telephone Encounter (Signed)
Will forward to MD. Isaiyah Feldhaus,CMA  

## 2018-10-08 NOTE — Telephone Encounter (Signed)
Pt has to have his medical records for legal matters. We explained that once he fills out the release it can 2-3 weeks before they are ready. PT said he would need them by Thursday.   Pt would like to know if Dr. Ky Barban can write a note stating to his lawyer that he has requested the medical records and that it can take multiple weeks to receive them.   Pt would like to pick this note up at the front desk. Please call pt when it is ready for pick up.

## 2018-10-10 ENCOUNTER — Encounter: Payer: Self-pay | Admitting: Family Medicine

## 2018-10-10 LAB — TOXASSURE SELECT 13 (MW), URINE

## 2018-10-10 NOTE — Telephone Encounter (Signed)
Letter made and routed to admin team for printing.

## 2018-10-10 NOTE — Progress Notes (Signed)
Please print for patient to pick up at front desk. Thanks!

## 2018-10-19 ENCOUNTER — Telehealth: Payer: Self-pay | Admitting: *Deleted

## 2018-10-19 NOTE — Telephone Encounter (Signed)
Patient calls to let MD know that he has tried the decrease in tramadol but it "is not touching the pain and I cant start exercising with this amount of pain". Ammi Hutt, Salome Spotted, CMA

## 2018-10-19 NOTE — Telephone Encounter (Signed)
Patient can take additional 50mg  pill if one pill is not covering pain to achieve previous dose as previously discussed at last visit.

## 2018-10-19 NOTE — Telephone Encounter (Signed)
Pt contacted and informed he can increase his dose to an extra 50mg  pill for increased pain. Pt verbalized understanding.

## 2018-11-03 ENCOUNTER — Other Ambulatory Visit: Payer: Self-pay | Admitting: Family Medicine
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
## 2018-11-23 ENCOUNTER — Ambulatory Visit (INDEPENDENT_AMBULATORY_CARE_PROVIDER_SITE_OTHER): Payer: Medicaid Other | Admitting: Family Medicine

## 2018-11-23 ENCOUNTER — Other Ambulatory Visit: Payer: Self-pay

## 2018-11-23 ENCOUNTER — Encounter: Payer: Self-pay | Admitting: Family Medicine

## 2018-11-23 VITALS — BP 140/80 | HR 74 | Temp 98.1°F | Ht 67.0 in | Wt 273.0 lb

## 2018-11-23 DIAGNOSIS — M4727 Other spondylosis with radiculopathy, lumbosacral region: Secondary | ICD-10-CM

## 2018-11-23 DIAGNOSIS — M5441 Lumbago with sciatica, right side: Secondary | ICD-10-CM | POA: Diagnosis not present

## 2018-11-23 DIAGNOSIS — M5442 Lumbago with sciatica, left side: Secondary | ICD-10-CM | POA: Diagnosis not present

## 2018-11-23 DIAGNOSIS — Z6841 Body Mass Index (BMI) 40.0 and over, adult: Secondary | ICD-10-CM

## 2018-11-23 DIAGNOSIS — G8929 Other chronic pain: Secondary | ICD-10-CM | POA: Diagnosis not present

## 2018-11-23 NOTE — Assessment & Plan Note (Signed)
Well controlled on tramadol, although was not successful with taper. Requests referral to neurosurgery, believe this is reasonable given stenosis on MRI, although mild, and numbness with positional changes. Again discussed weight loss and benefit to offload pressure from back.

## 2018-11-23 NOTE — Patient Instructions (Signed)
It was great to see you!  Our plans for today:  - We are referring you to Neurosurgery for your back pain. - It will help your back pain to lose weight. This will also help your liver. Call your nutritionist to set up another appointment and be sure to eat lots of fruits and vegetables (roughly ~5 servings per day). See below for tips.  Take care and seek immediate care sooner if you develop any concerns.   Dr. [facility]  Here is an example of what a healthy plate looks like:    ? Make half your plate fruits and vegetables.     ? Focus on whole fruits.     ? Vary your veggies.  ? Make half your grains whole grains. -     ? Look for the word "whole" at the beginning of the ingredients list    ? Some whole-grain ingredients include whole oats, whole-wheat flour,        whole-grain corn, whole-grain brown rice, and whole rye.  ? Move to low-fat and fat-free milk or yogurt.  ? Vary your protein routine. - Meat, fish, poultry (chicken, [place]), eggs, beans (kidney, pinto), dairy.  ? Drink and eat less sodium, saturated fat, and added sugars.

## 2018-11-23 NOTE — Assessment & Plan Note (Signed)
Not much weight change from prior visit. Encouraged to make appointment with nutritionist and work on diet and exercise. Handout provided.

## 2018-12-09 DIAGNOSIS — M48062 Spinal stenosis, lumbar region with neurogenic claudication: Secondary | ICD-10-CM | POA: Diagnosis not present

## 2018-12-17 ENCOUNTER — Other Ambulatory Visit: Payer: Self-pay | Admitting: Neurosurgery

## 2018-12-17 DIAGNOSIS — M48062 Spinal stenosis, lumbar region with neurogenic claudication: Secondary | ICD-10-CM

## 2018-12-26 ENCOUNTER — Other Ambulatory Visit: Payer: Self-pay | Admitting: Internal Medicine

## 2018-12-26 ENCOUNTER — Other Ambulatory Visit: Payer: Self-pay | Admitting: Family Medicine

## 2018-12-26 DIAGNOSIS — K219 Gastro-esophageal reflux disease without esophagitis: Secondary | ICD-10-CM

## 2018-12-28 ENCOUNTER — Other Ambulatory Visit: Payer: Medicaid Other

## 2018-12-30 ENCOUNTER — Telehealth: Payer: Self-pay | Admitting: *Deleted

## 2018-12-30 NOTE — Telephone Encounter (Signed)
Completed PA info in [place] for Tramadol.  Status pending.  Will recheck status in 24 hours. Christen Bame, CMA

## 2018-12-31 NOTE — Telephone Encounter (Signed)
Prior approval for Tramadol completed via [place] [employer].  Med approved for 12/30/2018 - 06/28/2019.  Prior approval # [id number].    [facility] pharmacy informed.  Christen Bame, CMA

## 2019-01-25 ENCOUNTER — Other Ambulatory Visit: Payer: Self-pay | Admitting: Family Medicine

## 2019-02-17 ENCOUNTER — Other Ambulatory Visit: Payer: Self-pay

## 2019-02-17 ENCOUNTER — Ambulatory Visit
Admission: RE | Admit: 2019-02-17 | Discharge: 2019-02-17 | Disposition: A | Discharge status: Home / Self Care | Payer: Medicaid Other | Source: Ambulatory Visit | Attending: Neurosurgery | Admitting: Neurosurgery

## 2019-02-17 DIAGNOSIS — M48062 Spinal stenosis, lumbar region with neurogenic claudication: Secondary | ICD-10-CM

## 2019-03-08 DIAGNOSIS — Z6841 Body Mass Index (BMI) 40.0 and over, adult: Secondary | ICD-10-CM | POA: Diagnosis not present

## 2019-03-08 DIAGNOSIS — I10 Essential (primary) hypertension: Secondary | ICD-10-CM | POA: Diagnosis not present

## 2019-03-08 DIAGNOSIS — M48062 Spinal stenosis, lumbar region with neurogenic claudication: Secondary | ICD-10-CM | POA: Diagnosis not present

## 2019-03-21 DIAGNOSIS — M47816 Spondylosis without myelopathy or radiculopathy, lumbar region: Secondary | ICD-10-CM | POA: Diagnosis not present

## 2019-03-21 DIAGNOSIS — Z6841 Body Mass Index (BMI) 40.0 and over, adult: Secondary | ICD-10-CM | POA: Diagnosis not present

## 2019-03-21 DIAGNOSIS — R030 Elevated blood-pressure reading, without diagnosis of hypertension: Secondary | ICD-10-CM | POA: Diagnosis not present

## 2019-03-21 DIAGNOSIS — M48062 Spinal stenosis, lumbar region with neurogenic claudication: Secondary | ICD-10-CM | POA: Diagnosis not present

## 2019-03-25 ENCOUNTER — Other Ambulatory Visit: Payer: Self-pay | Admitting: Internal Medicine

## 2019-03-25 ENCOUNTER — Other Ambulatory Visit: Payer: Self-pay | Admitting: Family Medicine

## 2019-03-25 DIAGNOSIS — Z9109 Other allergy status, other than to drugs and biological substances: Secondary | ICD-10-CM

## 2019-03-30 DIAGNOSIS — M48062 Spinal stenosis, lumbar region with neurogenic claudication: Secondary | ICD-10-CM | POA: Diagnosis not present

## 2019-05-11 DIAGNOSIS — R030 Elevated blood-pressure reading, without diagnosis of hypertension: Secondary | ICD-10-CM | POA: Diagnosis not present

## 2019-05-11 DIAGNOSIS — Z6841 Body Mass Index (BMI) 40.0 and over, adult: Secondary | ICD-10-CM | POA: Diagnosis not present

## 2019-05-11 DIAGNOSIS — M48062 Spinal stenosis, lumbar region with neurogenic claudication: Secondary | ICD-10-CM | POA: Diagnosis not present

## 2019-05-27 ENCOUNTER — Other Ambulatory Visit: Payer: Self-pay | Admitting: Internal Medicine

## 2019-05-27 ENCOUNTER — Other Ambulatory Visit: Payer: Self-pay | Admitting: Family Medicine

## 2019-06-22 DIAGNOSIS — M47816 Spondylosis without myelopathy or radiculopathy, lumbar region: Secondary | ICD-10-CM | POA: Diagnosis not present

## 2019-06-22 DIAGNOSIS — R030 Elevated blood-pressure reading, without diagnosis of hypertension: Secondary | ICD-10-CM | POA: Diagnosis not present

## 2019-06-22 DIAGNOSIS — M48062 Spinal stenosis, lumbar region with neurogenic claudication: Secondary | ICD-10-CM | POA: Diagnosis not present

## 2019-06-22 DIAGNOSIS — Z6841 Body Mass Index (BMI) 40.0 and over, adult: Secondary | ICD-10-CM | POA: Diagnosis not present

## 2019-06-27 ENCOUNTER — Other Ambulatory Visit: Payer: Self-pay | Admitting: Family Medicine

## 2019-06-27 NOTE — Telephone Encounter (Signed)
Refills provided. Please have pt make follow up appt for back pain. Thanks!

## 2019-06-29 DIAGNOSIS — M48062 Spinal stenosis, lumbar region with neurogenic claudication: Secondary | ICD-10-CM | POA: Diagnosis not present

## 2019-07-17 IMAGING — MR MRI LUMBAR SPINE WITHOUT CONTRAST
4 of 5 series · 18 of 48 positions shown · non-contrast
Comparison: Lumbar MRI 07/19/2009

CLINICAL DATA: Lumbar stenosis with neurogenic claudication

EXAM:
MRI LUMBAR SPINE WITHOUT CONTRAST
TECHNIQUE: Multiplanar, multisequence MR imaging of the lumbar spine was
performed. No intravenous contrast was administered.

[Series 5: T2 · sagittal · 4.0mm · 0.73mm/px · 7 of 18 slices shown (1 of 2)]
[im 1/18]
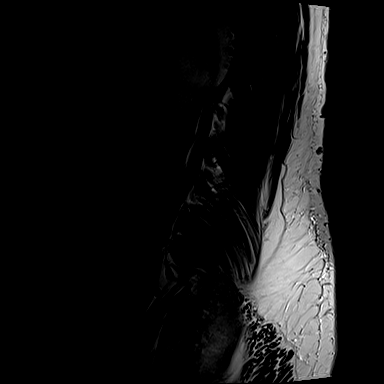
[im 3/18]
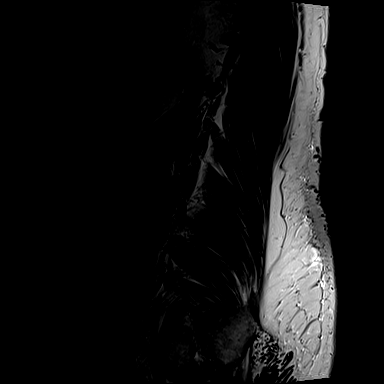
[im 6/18]
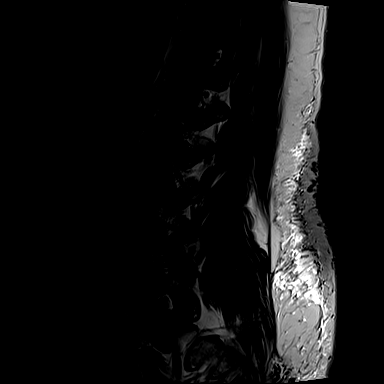
[im 9/18]
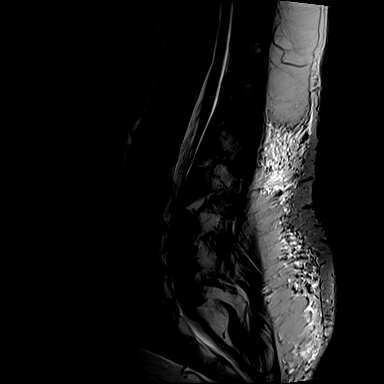
[im 12/18]
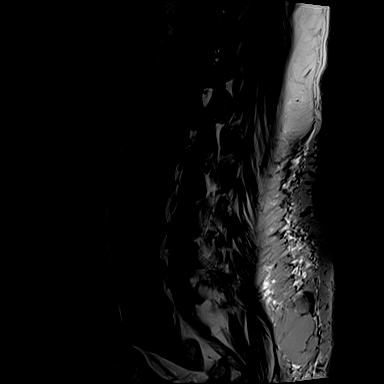
[im 15/18]
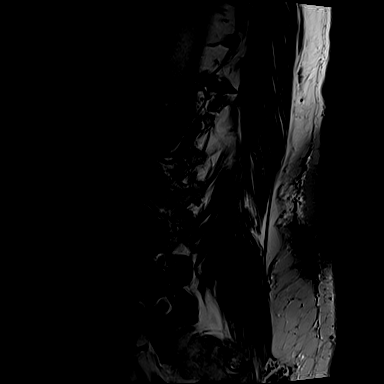
[im 18/18]
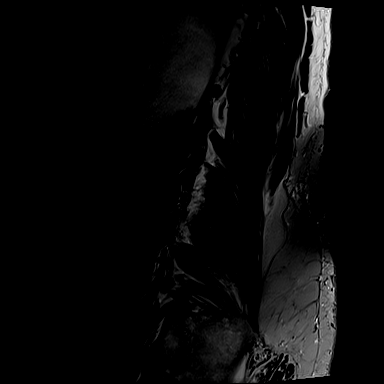

[Series 7: T1 · sagittal · 4.0mm · 0.73mm/px · 3 of 18 slices shown (1 of 2)]
[im 4/18]
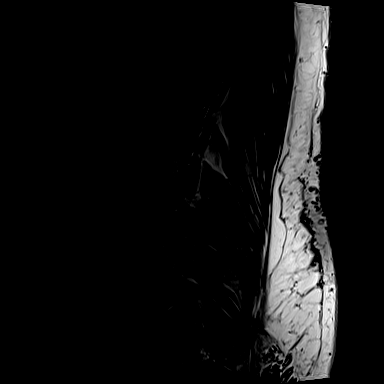
[im 11/18]
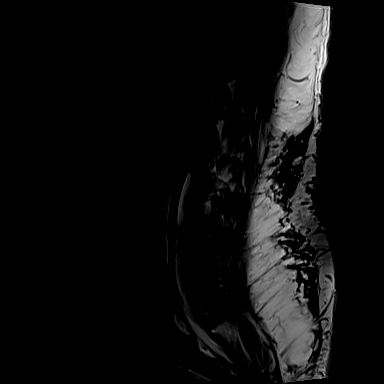
[im 18/18]
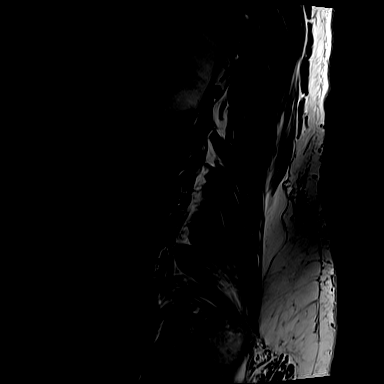

[Series 10: T1 · axial · 4.0mm · 0.28mm/px · z∈[-99,+57]mm · 3 of 41 slices shown (2 of 2)]
[im 7/41]
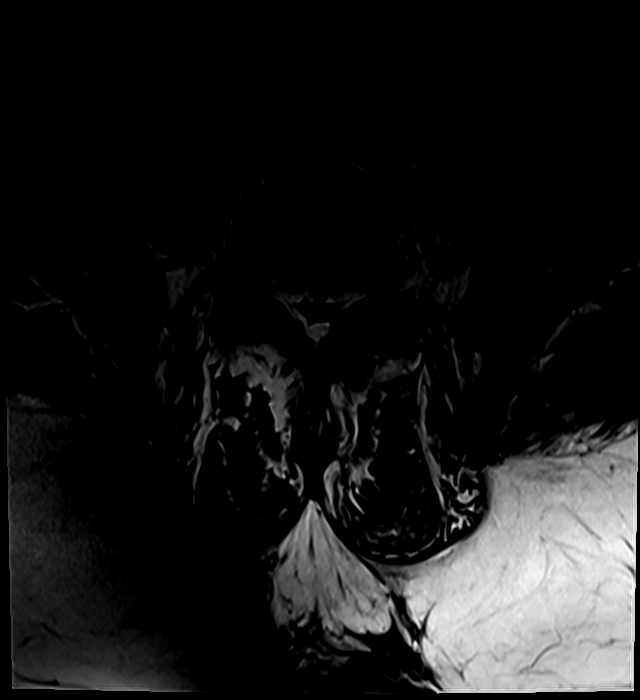
[im 22/41]
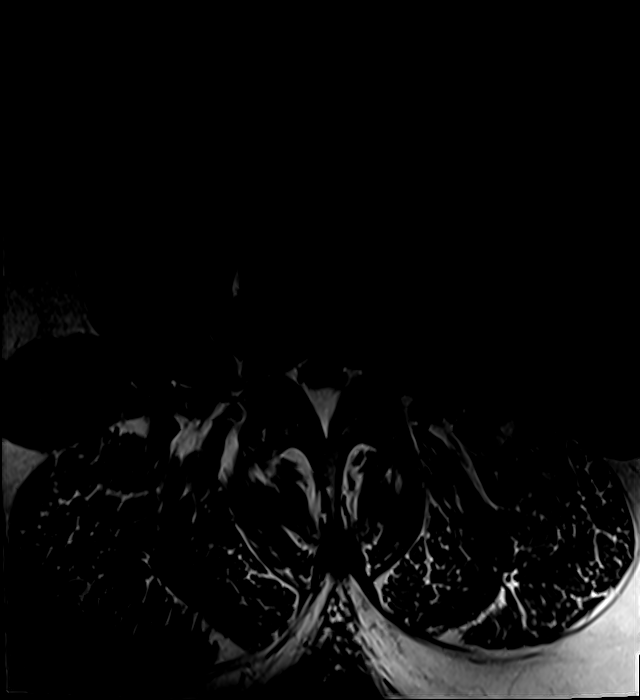
[im 34/41]
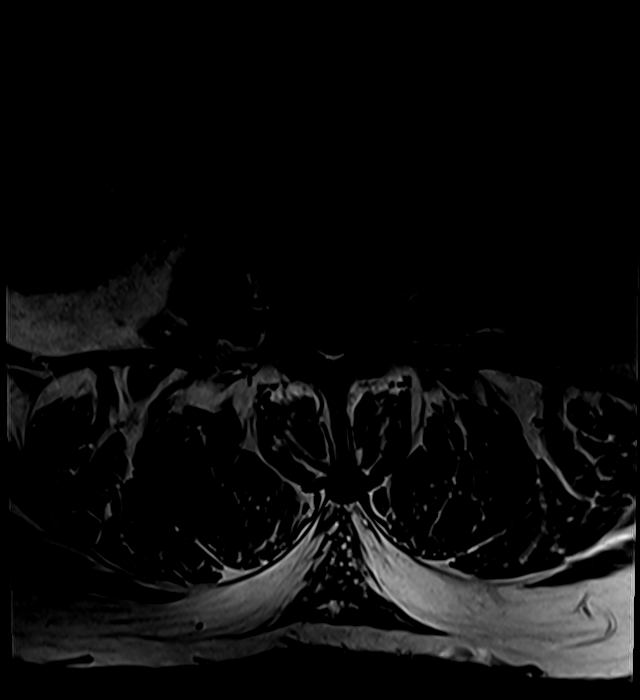

[Series 13: T2 · axial · 4.0mm · 0.28mm/px · z∈[-129,+57]mm · 5 of 41 slices shown (2 of 2)]
[im 1/41]
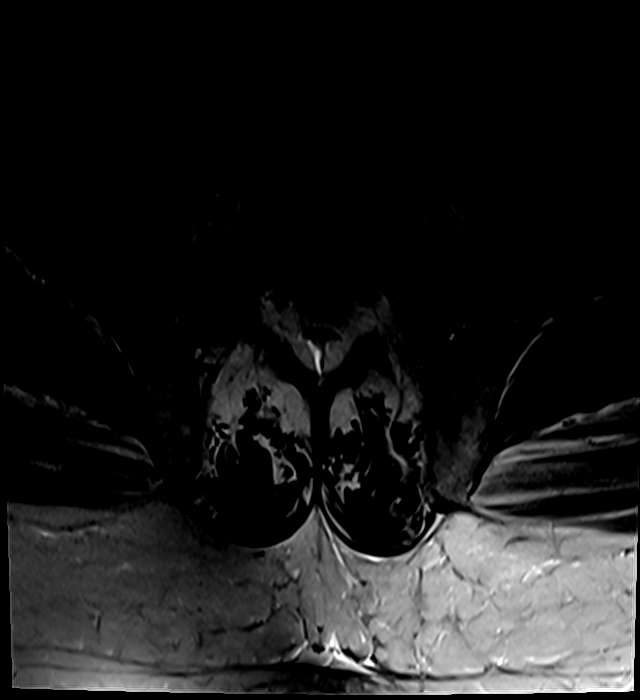
[im 7/41]
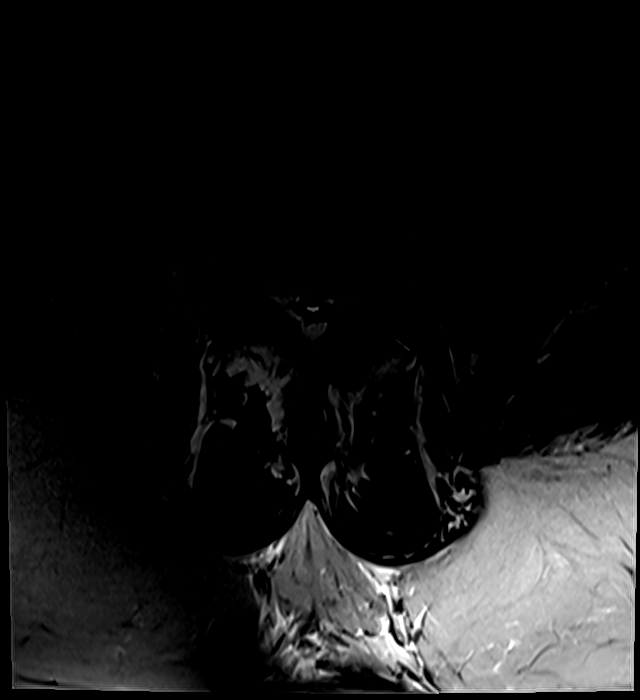
[im 13/41]
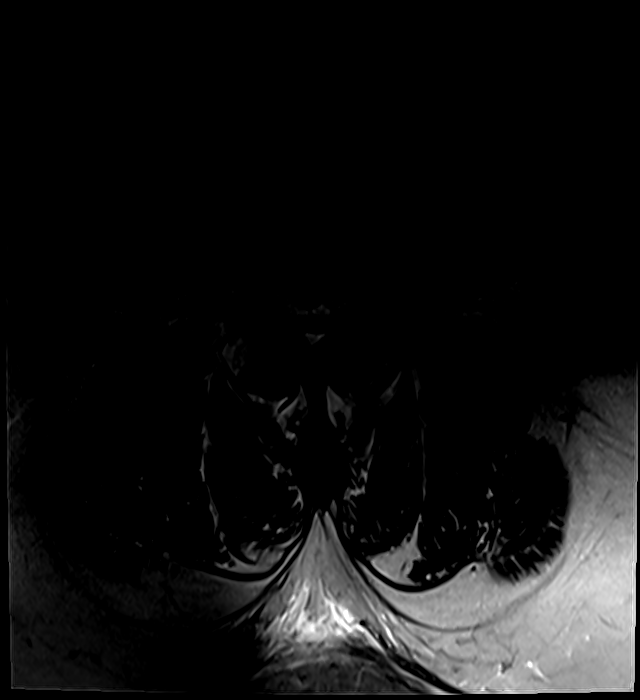
[im 22/41]
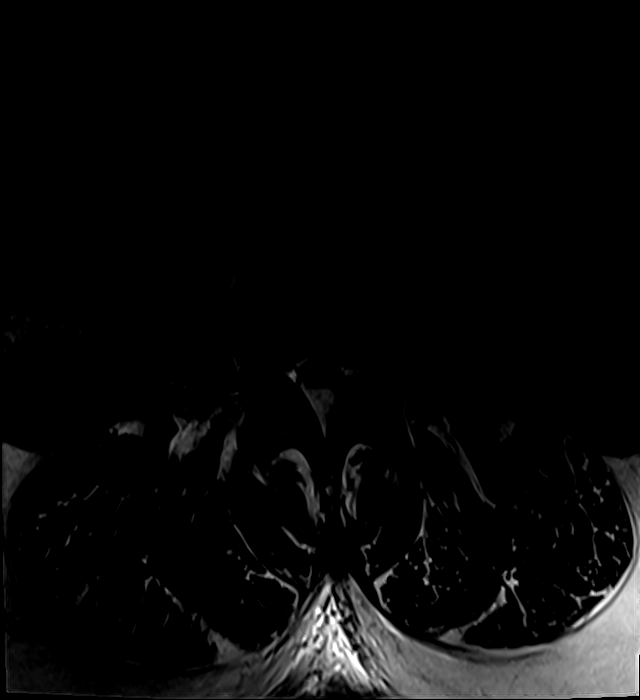
[im 34/41]
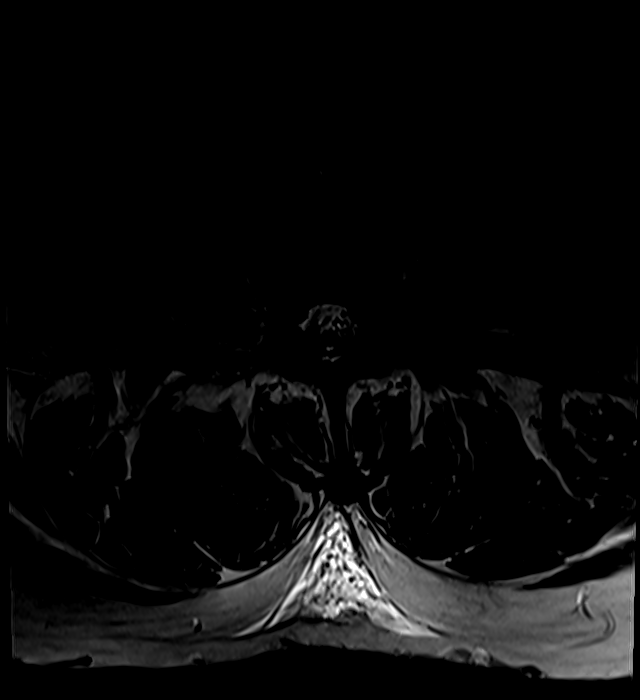

[18 of 48 positions shown; findings below may reference images not displayed]

FINDINGS: Segmentation:  Normal

Alignment:  Mild retrolisthesis L5-S1 otherwise normal alignment

Vertebrae:  Negative for fracture or mass.  Schmorl's node at L3-4.

Conus medullaris and cauda equina: Conus extends to the L1-2 level.
Conus and cauda equina appear normal.

Paraspinal and other soft tissues: Negative for paraspinous mass or
adenopathy.

Disc levels:

Congenital lumbar stenosis due to short pedicles and epidural
lipomatosis.

T12-L1: Negative

L1-2: Mild facet degeneration without significant spinal stenosis

L2-3: Moderate spinal stenosis. Diffuse disc bulging and facet
hypertrophy. Prominent left lateral disc and osteophyte complex is
new since the prior study. No significant foraminal encroachment.

L3-4: Moderate spinal stenosis. Diffuse disc bulging with spurring.
Schmorl's node. Shallow right foraminal disc protrusion has
progressed since the prior study. Bilateral facet degeneration.
Spinal stenosis has progressed in the interval.

L4-5: Moderate spinal stenosis with mild progression. Disc
degeneration with disc bulging and spurring and bilateral facet
hypertrophy. Mild subarticular stenosis bilaterally

L5-S1: Mild spinal stenosis due to epidural lipomatosis as well as
disc and facet degeneration with spurring.
IMPRESSION: Moderate congenital lumbar stenosis due to short pedicles. In
addition, there is epidural lipomatosis with diffuse narrowing of
thecal sac from L2 through S1.

Moderate spinal stenosis L2-3. Prominent left lateral disc and
osteophyte complex.

Moderate spinal stenosis L3-4. Shallow right foraminal disc
protrusion.

Moderate spinal stenosis L4-5 with mild subarticular stenosis
bilaterally.

Mild spinal stenosis L5-S1.

Mild progressive degenerative changes since 07/19/2009.

## 2019-07-27 ENCOUNTER — Other Ambulatory Visit: Payer: Self-pay | Admitting: Family Medicine

## 2019-07-27 NOTE — Telephone Encounter (Signed)
Reached out to pts sister. She told me to call back in 28mins and hung up the phone. Salvatore Marvel, CMA

## 2019-07-27 NOTE — Telephone Encounter (Signed)
Refill provided. Looks like he hasn't followed up in a while. Can you help him schedule an appointment to see me? Thanks!!

## 2019-07-27 NOTE — Telephone Encounter (Signed)
Attempted to reach pt. Number that is on file is a non working number. Salvatore Marvel, CMA

## 2019-08-01 ENCOUNTER — Telehealth: Payer: Self-pay | Admitting: *Deleted

## 2019-08-01 NOTE — Telephone Encounter (Signed)
Completed PA info in [place] Tr[employer] for tramadol.  Status pending.  Will recheck status in 24 hours. Sanii Kukla, CMA    

## 2019-08-01 NOTE — Telephone Encounter (Signed)
Pt called again for tramadol PA. I informed pt the PA has been done and we would be checking the status in 24 hours. Pt understands. Ottis Stain, CMA

## 2019-08-03 NOTE — Telephone Encounter (Signed)
Medication approved.  Pharmacy informed.  Christen Bame, CMA

## 2019-08-08 ENCOUNTER — Ambulatory Visit: Payer: Medicaid Other | Admitting: Family Medicine
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
## 2019-08-22 ENCOUNTER — Other Ambulatory Visit: Payer: Self-pay

## 2019-08-22 ENCOUNTER — Encounter: Payer: Self-pay | Admitting: Family Medicine

## 2019-08-22 ENCOUNTER — Ambulatory Visit (INDEPENDENT_AMBULATORY_CARE_PROVIDER_SITE_OTHER): Payer: Medicaid Other | Admitting: Family Medicine

## 2019-08-22 VITALS — BP 128/70 | HR 106 | Ht 67.0 in | Wt 285.0 lb

## 2019-08-22 DIAGNOSIS — G894 Chronic pain syndrome: Secondary | ICD-10-CM

## 2019-08-22 DIAGNOSIS — Z0283 Encounter for blood-alcohol and blood-drug test: Secondary | ICD-10-CM

## 2019-08-22 DIAGNOSIS — M48062 Spinal stenosis, lumbar region with neurogenic claudication: Secondary | ICD-10-CM

## 2019-08-22 NOTE — Assessment & Plan Note (Signed)
Doing well remaining functional on tramadol though does report he sometimes has to use 4 times daily instead of prescribed 3 times daily.  Advised patient as he continues on epidural injections, he hopefully will be able to use less tramadol.  Again counseled on benefit of weight loss, see AVS for recommendations on diet and exercise.  Given he has previously not done well with taper, will continue at current dose and number of pills.  PDMP reviewed today and without red flags.  UDS obtained today.  Follow-up in 3 months.

## 2019-08-22 NOTE — Patient Instructions (Signed)
It was great to see you!  Our plans for today:  - No changes to your medications.  - Keep working on losing weight, this will help take some of the pressure off of your back. Work on eating smaller portions, 2-3 meals per day. See below for tips. - Follow up in 3 months.  Take care and seek immediate care sooner if you develop any concerns.   Dr. [facility]  Here is an example of what a healthy plate looks like:    ? Make half your plate fruits and vegetables.     ? Focus on whole fruits.     ? Vary your veggies.  ? Make half your grains whole grains. -     ? Look for the word "whole" at the beginning of the ingredients list    ? Some whole-grain ingredients include whole oats, whole-wheat flour,        whole-grain corn, whole-grain brown rice, and whole rye.  ? Move to low-fat and fat-free milk or yogurt.  ? Vary your protein routine. - Meat, fish, poultry (chicken, [place]), eggs, beans (kidney, pinto), dairy.  ? Drink and eat less sodium, saturated fat, and added sugars.\
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
## 2019-08-26 ENCOUNTER — Other Ambulatory Visit: Payer: Self-pay | Admitting: Family Medicine

## 2019-08-26 LAB — TOXASSURE SELECT 13 (MW), URINE

## 2019-10-25 ENCOUNTER — Telehealth: Payer: Self-pay | Admitting: Family Medicine

## 2019-10-25 NOTE — Telephone Encounter (Signed)
**  After Hours/ Emergency Line Call**  Received a call to report that Mark Hale had passed away. Call was received by EMS. Per EMS, patient had edema x2 days and today was more short of breath. He went to sleep and 3 hours later was found to be un-arousable by partner. On arrival by EMS patient was cold and in asystole. Of note partner was COVID positive, patient was not tested. Partner reports no recent worsening of depression, no SI. Patient was found to be sitting in living room by partner and EMS. On chart review patient has PMHx significant for hyperlipidemia, obesity, pre diabetes, and tobacco use. Called Dr. Andria Frames to inform him as he is attending on call. EMS stated they will send death certificate to PCP (Dr. Ky Barban) at [facility].   Will forward to PCP.   Caroline More, DO PGY-3, [facility] 10/13/2019 8:57 PM

## 2019-10-31 DEATH — deceased

## 2019-11-01 NOTE — Telephone Encounter (Signed)
Noted. Called pharmacy and informed. Will route to admin team to change patient's status to deceased.

## 2019-11-08 ENCOUNTER — Telehealth: Payer: Self-pay | Admitting: Family Medicine

## 2019-11-08 NOTE — Telephone Encounter (Signed)
Mark Hale came into office to drop off pt's death certificate. Placed death certificate in PCP'S box to be signed.

## 2019-11-08 NOTE — Telephone Encounter (Signed)
Completed death certificate. Please call and inform that death certificate is complete. Phone number on sticky tab: [phone]. Placed up front   Caroline More, DO, PGY-3 [facility] 11/08/2019 5:20 PM

## 2019-11-08 NOTE — Telephone Encounter (Signed)
Will forward to Dr. Ky Barban.  Candita Borenstein,CMA

## 2019-11-09 NOTE — Telephone Encounter (Signed)
Death certificate given to [place].  She will call funeral home to pick this up.  Rhea Thrun,CMA
